# Patient Record
Sex: Male | Born: 2000 | Race: Black or African American | Hispanic: No | Marital: Single | Smoking: Never smoker
Health system: Southern US, Community
[De-identification: ages and names within clinical notes are randomized; demographics above are authoritative.]

## PROBLEM LIST (undated history)

## (undated) DIAGNOSIS — S89039A Salter-Harris Type III physeal fracture of upper end of unspecified tibia, initial encounter for closed fracture: Secondary | ICD-10-CM

## (undated) HISTORY — PX: CIRCUMCISION: SUR203

---
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
## 2021-02-26 ENCOUNTER — Encounter: Payer: Self-pay | Admitting: Physical Therapy

## 2021-02-26 ENCOUNTER — Ambulatory Visit: Payer: Medicaid Other | Attending: Orthopedic Surgery | Admitting: Physical Therapy

## 2021-02-26 ENCOUNTER — Other Ambulatory Visit: Payer: Self-pay

## 2021-02-26 DIAGNOSIS — G8929 Other chronic pain: Secondary | ICD-10-CM | POA: Diagnosis present

## 2021-02-26 DIAGNOSIS — M6281 Muscle weakness (generalized): Secondary | ICD-10-CM | POA: Diagnosis present

## 2021-02-26 DIAGNOSIS — M25561 Pain in right knee: Secondary | ICD-10-CM | POA: Diagnosis present

## 2021-02-26 NOTE — Patient Instructions (Signed)
Access Code: [email or web address] URL: [email or web address] Date: 02/26/2021 Prepared by: Rosana Hoes  Exercises Active Straight Leg Raise with Quad Set - 1 x daily - 4 x weekly - 3 sets - 10 reps Standing Terminal Knee Extension with Resistance - 1 x daily - 4 x weekly - 3 sets - 10 reps Goblet Squat with Kettlebell - 1 x daily - 4 x weekly - 3 sets - 10 reps Side Stepping with Resistance at Thighs - 1 x daily - 4 x weekly - 3 sets - 20 reps Prone Quadriceps Stretch with Strap - 2 x daily - 7 x weekly - 3 reps - 30 hold Gastroc Stretch on Wall - 2 x daily - 7 x weekly - 3 reps - 30 hold

## 2021-02-27 ENCOUNTER — Encounter: Payer: Self-pay | Admitting: Physical Therapy

## 2021-02-27 NOTE — Therapy (Signed)
[facility] [address] [place], [place], [address] Phone: [phone]   Fax:  [phone]  Physical Therapy Evaluation  Patient Details  Name: Benjamin Lam MRN: [id number] Date of Birth: 29-Dec-2000 Referring Provider (PT): Sheral ApleyMurphy, Timothy D, MD   Encounter Date: 02/26/2021   PT End of Session - 02/27/21 0809    Visit Number 1    Number of Visits 8    Date for PT Re-Evaluation 04/23/21    Authorization Type [employer]    Authorization - Visit Number 1    Authorization - Number of Visits 12    PT Start Time 1445    PT Stop Time 1530    PT Time Calculation (min) 45 min    Activity Tolerance Patient tolerated treatment well    Behavior During Therapy [facility] for tasks assessed/performed           Past Medical History:  Diagnosis Date  . Closed Salter-Harris type III physeal fracture of proximal end of tibia 12/14/2016    Past Surgical History:  Procedure Laterality Date  . CIRCUMCISION    . FASCIOTOMY Right 12/14/2016   Procedure: Anterior compression FASCIOTOMY;  Surgeon: Sheral Apleyimothy D Murphy, MD;  [facility];  Service: Orthopedics;  Laterality: Right;  . HARDWARE REMOVAL Right 06/05/2017   Procedure: HARDWARE REMOVAL, RIGHT PROXIMAL TIBIA SCREW REMOVAL;  Surgeon: Sheral ApleyMurphy, Timothy D, MD;  Location: [facility];  Service: Orthopedics;  Laterality: Right;  . ORIF TIBIA PLATEAU Right 12/14/2016   Procedure: LEG PERCANTANEOUS PROXIMAL TIBIAL FRACTURE;  Surgeon: Sheral Apleyimothy D Murphy, MD;  [facility];  Service: Orthopedics;  Laterality: Right;  . PATELLAR TENDON REPAIR  12/14/2016   Procedure: PATELLA TENDON REPAIR;  Surgeon: Sheral Apleyimothy D Murphy, MD;  [facility];  Service: Orthopedics;;    There were no vitals filed for this visit.    Subjective Assessment - 02/26/21 1447    Subjective Patient reports since 2018 he had a surgery, states he broke everything in his knee. Patient plays basketball and states  that when he goes long periods of playing and working out that he will have knee pain. Pain is located to the front of the knee, occasionally has non-painful clicking and popping in the knee. Patient is working out 4-5 days per week to get back ready for basketball in the fall. He is going back to school in August.    Limitations Lifting;Other (comment);Sitting   basketball   How long can you sit comfortably? 30-60 minutes will get tense    How long can you stand comfortably? No limitation    How long can you walk comfortably? No limitation    Patient Stated Goals Improve knee pain and strength to return to basketball    Currently in Pain? No/denies    Pain Score 0-No pain    Pain Location Knee    Pain Orientation Right    Pain Descriptors / Indicators Aching    Pain Type Chronic pain    Pain Onset More than a month ago    Pain Frequency Intermittent    Aggravating Factors  Sitting extended periods, playing basketball or working out days in a row without rest    Pain Relieving Factors Rest, ice, heat              [facility] PT Assessment - 02/27/21 0001      Assessment   Medical Diagnosis Right knee pain    Referring Provider (PT) Sheral ApleyMurphy, Timothy D, MD  Onset Date/Surgical Date 12/14/16    Next MD Visit Not scheduled    Prior Therapy Yes      Precautions   Precautions None      Restrictions   Weight Bearing Restrictions No      Balance Screen   Has the patient fallen in the past 6 months No    Has the patient had a decrease in activity level because of a fear of falling?  No    Is the patient reluctant to leave their home because of a fear of falling?  No      Home Tourist information centre manager residence      Prior Function   Level of Independence Independent    Vocation Student    Vocation Requirements Going back to [employer]      Cognition   Overall Cognitive Status Within Functional Limits for tasks assessed       Observation/Other Assessments   Observations Patient appears in no apparent distress    Focus on Therapeutic Outcomes (FOTO)  NA - MCD      Functional Tests   Functional tests Squat;Single Leg Squat      Squat   Comments Patient with decreased depth and when cued to increased depth would perform knee dominant pattern with heels coming up; when propped heels up on barbell plates able to perform adequate depth squat with proper technique      Single Leg Squat   Comments Patient with decreased depth and mild dynamic knee valgus      ROM / Strength   AROM / PROM / Strength AROM;Strength;PROM      AROM   AROM Assessment Site Knee;Ankle    Right/Left Knee Right;Left    Right Knee Extension 0    Right Knee Flexion 135    Left Knee Extension 8   hyperextension   Left Knee Flexion 135    Right/Left Ankle Right;Left    Right Ankle Dorsiflexion 3    Left Ankle Dorsiflexion 8      PROM   PROM Assessment Site Knee    Right/Left Knee Right    Right Knee Extension 5   hyper     Strength   Strength Assessment Site Hip;Knee;Ankle    Right/Left Hip Right;Left    Right Hip Flexion 4+/5    Right Hip Extension 4/5    Right Hip ABduction 4/5    Left Hip Flexion 4+/5    Left Hip Extension 4+/5    Left Hip ABduction 4+/5    Right/Left Knee Right;Left    Right Knee Flexion 5/5    Right Knee Extension 4+/5    Left Knee Flexion 5/5    Left Knee Extension 5/5    Right/Left Ankle Right;Left    Right Ankle Dorsiflexion 5/5    Right Ankle Plantar Flexion 4+/5    Left Ankle Dorsiflexion 5/5    Left Ankle Plantar Flexion 5/5      Flexibility   Soft Tissue Assessment /Muscle Length yes    Hamstrings WFL bilaterally    Quadriceps Limited on right      Palpation   Patella mobility WFL    Palpation comment Non-TTP      Special Tests   Other special tests All meniscal and ligamentous testing negative                      Objective measurements completed on examination: See  above  findings.       [facility] Adult PT Treatment/Exercise - 02/27/21 0001      Exercises   Exercises Knee/Hip      Knee/Hip Exercises: Stretches   Quad Stretch 2 reps;30 seconds    Quad Stretch Limitations prone quad stretch    Gastroc Stretch 2 reps;30 seconds    Gastroc Stretch Limitations standing runner stretch      Knee/Hip Exercises: Standing   Terminal Knee Extension 2 sets;10 reps    Theraband Level (Terminal Knee Extension) Level 3 (Green)    Functional Squat 2 sets;10 reps   15#   Functional Squat Limitations goblet squat with heels propped on barbell plates    Other Standing Knee Exercises Lateral band walk with green around knees 2 x 20      Knee/Hip Exercises: Supine   Straight Leg Raises 2 sets;10 reps    Straight Leg Raises Limitations towel roll placed under heel to encourage active hyperextension                  PT Education - 02/26/21 1543    Education Details Exam findings, POC, HEP    Person(s) Educated Patient    Methods Explanation;Demonstration;Tactile cues;Verbal cues;Handout    Comprehension Verbalized understanding;Returned demonstration;Verbal cues required;Tactile cues required;Need further instruction            PT Short Term Goals - 02/27/21 0816      PT SHORT TERM GOAL #1   Title Patient will be I with initial HEP to progress with PT    Baseline provided at eval    Time 4    Period Weeks    Status New    Target Date 03/26/21      PT SHORT TERM GOAL #2   Title Patient will be able to perform 10 reps SLR without extension lag or fatigue/difficulty    Baseline patient reports difficulty and slight extension lag due to fatigue with 10 reps SLR    Time 4    Period Weeks    Status New    Target Date 03/26/21      PT SHORT TERM GOAL #3   Title Patient will exhibit 5 deg right knee ATKHE to improve quad and knee control    Baseline right knee 0 deg extension    Time 4    Period Weeks    Status New    Target Date 03/26/21       PT SHORT TERM GOAL #4   Title Patient will achieve 5 deg ankle DF on right to improve squat depth    Baseline 3 deg right ankle DF    Time 4    Period Weeks    Status New    Target Date 03/26/21             PT Long Term Goals - 02/27/21 0820      PT LONG TERM GOAL #1   Title Patient will be I with final HEP to maintain progress from PT    Baseline provided at eval    Time 8    Period Weeks    Status New    Target Date 04/23/21      PT LONG TERM GOAL #2   Title Patient will exhibit 5/5 MMT knee strength to improve tolerance for basketball    Baseline 4+/5 MMT    Time 8    Period Weeks    Status New    Target Date 04/23/21  PT LONG TERM GOAL #3   Title Patient will exhibit >/= 4+/5 MMT hip strength to improve single leg control with squat to reduce dynamic knee valgus    Baseline 4/5 MMT    Time 8    Period Weeks    Status New    Target Date 04/23/21      PT LONG TERM GOAL #4   Title Patient will report no limitation with consecutive days of basketball training to improve functional ability    Baseline patient reports increased pain and limitation with multiple days of basketball    Time 8    Period Weeks    Status New    Target Date 04/23/21      PT LONG TERM GOAL #5   Title Patient will achieve >/= 10 deg ankle DF to improve squat technique to reduce stress on anterior knee    Baseline right 3 deg, left 8 deg    Time 8    Period Weeks    Status New    Target Date 04/23/21                  Plan - 02/27/21 0810    Clinical Impression Statement Patient presents to PT with report of chronic right knee pain with continuous days of activity and sitting extended periods. Patient's knee pain seems most consistent wth patellofemoral pain. He exhibit gross strength deficit of right side, reporting difficulty with 10 reps of SLR and inability to perform active hyperextension of right knee equal to passive motion. Patient also exhibit limitations in quad  and calf flexibility that limit squat for and result in increase stress placed on anterior knee. Patient provided exercises to addess listed impairments and he would benefit from continued skilled PT to progress his strength and mobility in order to reduce pain and return to high level athletics including basketball without limitation.    Personal Factors and Comorbidities Past/Current Experience;Time since onset of injury/illness/exacerbation    Examination-Activity Limitations Locomotion Level;Sit;Squat;Stairs;Lift    Examination-Participation Restrictions Community Activity;School;Other   basketball   Stability/Clinical Decision Making Stable/Uncomplicated    Clinical Decision Making Low    Rehab Potential Good    PT Frequency 1x / week    PT Duration 8 weeks    PT Treatment/Interventions ADLs/Self Care Home Management;Aquatic Therapy;Cryotherapy;Electrical Stimulation;Iontophoresis 4mg /ml Dexamethasone;Moist Heat;Ultrasound;Neuromuscular re-education;Balance training;Therapeutic exercise;Therapeutic activities;Functional mobility training;Stair training;Gait training;Patient/family education;Manual techniques;Dry needling;Passive range of motion;Taping;Spinal Manipulations;Joint Manipulations    PT Next Visit Plan Review HEP and progress PRN, continue quad strengthening and improve active hyperextension, manual/stretching for quad and calf flexibility, hip strengthening (squat, step-up, lunge, deadlift, etc)    PT Home Exercise Plan    Consulted and Agree with Plan of Care Patient           Patient will benefit from skilled therapeutic intervention in order to improve the following deficits and impairments:  Decreased range of motion,Pain,Decreased activity tolerance,Decreased strength,Impaired flexibility,Improper body mechanics  Visit Diagnosis: Chronic pain of right knee  Muscle weakness (generalized)     Problem List Patient Active Problem List   Diagnosis Date Noted   . Closed Salter-Harris type III physeal fracture of proximal end of tibia 12/14/2016  . Closed Salter-Harris type III physeal fracture of proximal end of right tibia 12/14/2016    02/13/2017, PT, DPT, LAT, ATC 02/27/21  8:25 AM Phone: [phone] Fax: [phone]   [facility] [address] [place], [place], [place] Phone: [phone]   Fax:  [phone]  Name: Benjamin Lam MRN: [id number] Date of Birth: [birth year]

## 2021-03-09 ENCOUNTER — Other Ambulatory Visit: Payer: Self-pay

## 2021-03-09 ENCOUNTER — Ambulatory Visit: Payer: Medicaid Other | Admitting: Physical Therapy

## 2021-03-09 ENCOUNTER — Encounter: Payer: Self-pay | Admitting: Physical Therapy

## 2021-03-09 DIAGNOSIS — M25561 Pain in right knee: Secondary | ICD-10-CM

## 2021-03-09 DIAGNOSIS — G8929 Other chronic pain: Secondary | ICD-10-CM

## 2021-03-09 DIAGNOSIS — M6281 Muscle weakness (generalized): Secondary | ICD-10-CM

## 2021-03-09 NOTE — Therapy (Signed)
[facility] Outpatient Rehabilitation [facility] [address] [place], [place], [address] Phone: [phone]   Fax:  [phone]  Physical Therapy Treatment  Patient Details  Name: Benjamin Lam MRN: [id number] Date of Birth: 2001/07/01 Referring Provider (PT): Sheral Apley, MD   Encounter Date: 03/09/2021   PT End of Session - 03/09/21 1000    Visit Number 2    Number of Visits 8    Date for PT Re-Evaluation 04/23/21    Authorization Type [employer]    Authorization - Visit Number 2    Authorization - Number of Visits 12    PT Start Time [phone]    PT Stop Time 1055    PT Time Calculation (min) 59 min    Activity Tolerance Patient tolerated treatment well    Behavior During Therapy [facility] for tasks assessed/performed           Past Medical History:  Diagnosis Date  . Closed Salter-Harris type III physeal fracture of proximal end of tibia 12/14/2016    Past Surgical History:  Procedure Laterality Date  . CIRCUMCISION    . FASCIOTOMY Right 12/14/2016   Procedure: Anterior compression FASCIOTOMY;  Surgeon: Sheral Apley, MD;  Location: [facility] OR;  Service: Orthopedics;  Laterality: Right;  . HARDWARE REMOVAL Right 06/05/2017   Procedure: HARDWARE REMOVAL, RIGHT PROXIMAL TIBIA SCREW REMOVAL;  Surgeon: Sheral Apley, MD;  Location: [facility];  Service: Orthopedics;  Laterality: Right;  . ORIF TIBIA PLATEAU Right 12/14/2016   Procedure: LEG PERCANTANEOUS PROXIMAL TIBIAL FRACTURE;  Surgeon: Sheral Apley, MD;  Location: [facility] OR;  Service: Orthopedics;  Laterality: Right;  . PATELLAR TENDON REPAIR  12/14/2016   Procedure: PATELLA TENDON REPAIR;  Surgeon: Sheral Apley, MD;  Location: [facility] OR;  Service: Orthopedics;;    There were no vitals filed for this visit.   Subjective Assessment - 03/09/21 0959    Subjective Patient reports he is doing well, denies pain and states he feels his strength is coming along.    Patient Stated Goals  Improve knee pain and strength to return to basketball    Currently in Pain? No/denies              [facility] PT Assessment - 03/09/21 0001      Functional Tests   Functional tests Single leg stance      Single Leg Stance   Comments Patient able to maintain SLS > 30 sec each but with greater difficulty on right, requires UE assist for dynamic balance on right                         [facility] Adult PT Treatment/Exercise - 03/09/21 0001      Exercises   Exercises Knee/Hip      Knee/Hip Exercises: Stretches   Lobbyist 2 reps;30 seconds    Quad Stretch Limitations prone quad stretch    Gastroc Stretch 2 reps;30 seconds    Gastroc Stretch Limitations slant board      Knee/Hip Exercises: Aerobic   Elliptical L5 R1 x 5 min while taking subjective      Knee/Hip Exercises: Machines for Strengthening   Cybex Leg Press DL: 354# x 8, 562# 3 x 6; SL: 60# x 6, 70# x 6, 80# 2 x 6      Knee/Hip Exercises: Standing   Other Standing Knee Exercises SL RDL with slider 2 x 8   holding 10# in contralateral hand  Other Standing Knee Exercises Rear foot elevated split squat 2 x 8   contralateral UE support for balance, holding 10# in ipsilateral hand     Knee/Hip Exercises: Sidelying   Other Sidelying Knee/Hip Exercises Side plank on knees with clamshell 2 x 10 with green band      Modalities   Modalities Cryotherapy   post-therapy     Cryotherapy   Number Minutes Cryotherapy 10 Minutes    Cryotherapy Location Knee   right   Type of Cryotherapy Ice pack                  PT Education - 03/09/21 1000    Education Details HEP update    Person(s) Educated Patient    Methods Explanation;Demonstration;Tactile cues;Verbal cues;Handout    Comprehension Verbalized understanding;Need further instruction;Returned demonstration;Verbal cues required;Tactile cues required            PT Short Term Goals - 02/27/21 0816      PT SHORT TERM GOAL #1   Title Patient will be I  with initial HEP to progress with PT    Baseline provided at eval    Time 4    Period Weeks    Status New    Target Date 03/26/21      PT SHORT TERM GOAL #2   Title Patient will be able to perform 10 reps SLR without extension lag or fatigue/difficulty    Baseline patient reports difficulty and slight extension lag due to fatigue with 10 reps SLR    Time 4    Period Weeks    Status New    Target Date 03/26/21      PT SHORT TERM GOAL #3   Title Patient will exhibit 5 deg right knee ATKHE to improve quad and knee control    Baseline right knee 0 deg extension    Time 4    Period Weeks    Status New    Target Date 03/26/21      PT SHORT TERM GOAL #4   Title Patient will achieve 5 deg ankle DF on right to improve squat depth    Baseline 3 deg right ankle DF    Time 4    Period Weeks    Status New    Target Date 03/26/21             PT Long Term Goals - 02/27/21 0820      PT LONG TERM GOAL #1   Title Patient will be I with final HEP to maintain progress from PT    Baseline provided at eval    Time 8    Period Weeks    Status New    Target Date 04/23/21      PT LONG TERM GOAL #2   Title Patient will exhibit 5/5 MMT knee strength to improve tolerance for basketball    Baseline 4+/5 MMT    Time 8    Period Weeks    Status New    Target Date 04/23/21      PT LONG TERM GOAL #3   Title Patient will exhibit >/= 4+/5 MMT hip strength to improve single leg control with squat to reduce dynamic knee valgus    Baseline 4/5 MMT    Time 8    Period Weeks    Status New    Target Date 04/23/21      PT LONG TERM GOAL #4   Title Patient will report no limitation with consecutive days  of basketball training to improve functional ability    Baseline patient reports increased pain and limitation with multiple days of basketball    Time 8    Period Weeks    Status New    Target Date 04/23/21      PT LONG TERM GOAL #5   Title Patient will achieve >/= 10 deg ankle DF to  improve squat technique to reduce stress on anterior knee    Baseline right 3 deg, left 8 deg    Time 8    Period Weeks    Status New    Target Date 04/23/21                 Plan - 03/09/21 1001    Clinical Impression Statement Patient tolerated therapy well with no adverse effects. Therapy focused primarily on progressing strength for patient. Exercises incorporated single leg strengthening and patient did demonstrate a balance deficit greater on the right. He did require occasional cueing for exercise technique and slow/controlled movement. No pain reported with therapy and HEP updated this visit. Finished with cold pack to reduce residual soreness. Patient would benefit from continued skilled PT to progress his strength and mobility in order to reduce pain and return to high level athletics including basketball without limitation.    PT Treatment/Interventions ADLs/Self Care Home Management;Aquatic Therapy;Cryotherapy;Electrical Stimulation;Iontophoresis 4mg /ml Dexamethasone;Moist Heat;Ultrasound;Neuromuscular re-education;Balance training;Therapeutic exercise;Therapeutic activities;Functional mobility training;Stair training;Gait training;Patient/family education;Manual techniques;Dry needling;Passive range of motion;Taping;Spinal Manipulations;Joint Manipulations    PT Next Visit Plan Review HEP and progress PRN, continue quad strengthening and improve active hyperextension, manual/stretching for quad and calf flexibility, hip strengthening (squat, step-up, lunge, deadlift, etc)    PT Home Exercise Plan    Consulted and Agree with Plan of Care Patient           Patient will benefit from skilled therapeutic intervention in order to improve the following deficits and impairments:  Decreased range of motion,Pain,Decreased activity tolerance,Decreased strength,Impaired flexibility,Improper body mechanics  Visit Diagnosis: Chronic pain of right knee  Muscle weakness  (generalized)     Problem List Patient Active Problem List   Diagnosis Date Noted  . Closed Salter-Harris type III physeal fracture of proximal end of tibia 12/14/2016  . Closed Salter-Harris type III physeal fracture of proximal end of right tibia 12/14/2016    02/13/2017, PT, DPT, LAT, ATC 03/09/21  10:53 AM Phone: [phone] Fax: [phone]   [facility] [address] [place], [place], [place] Phone: [phone]   Fax:  [phone]  Name: Benjamin Lam MRN: [name] Date of Birth: [birth year]

## 2021-03-09 NOTE — Patient Instructions (Signed)
Access Code: [email or web address] URL: [email or web address] Date: 03/09/2021 Prepared by: Rosana Hoes  Exercises Active Straight Leg Raise with Quad Set - 1 x daily - 4 x weekly - 3 sets - 10 reps Standing Terminal Knee Extension with Resistance - 1 x daily - 4 x weekly - 3 sets - 10 reps Goblet Squat with Kettlebell - 1 x daily - 4 x weekly - 3 sets - 10 reps Side Stepping with Resistance at Thighs - 1 x daily - 4 x weekly - 3 sets - 20 reps Single Leg Lunge with Foot on Bench - 1 x daily - 4 x weekly - 3-4 sets - 6-8 reps Prone Quadriceps Stretch with Strap - 2 x daily - 7 x weekly - 3 reps - 30 hold Gastroc Stretch on Wall - 2 x daily - 7 x weekly - 3 reps - 30 hold

## 2021-03-16 ENCOUNTER — Encounter: Payer: Self-pay | Admitting: Physical Therapy

## 2021-03-16 ENCOUNTER — Other Ambulatory Visit: Payer: Self-pay

## 2021-03-16 ENCOUNTER — Ambulatory Visit: Payer: Medicaid Other | Attending: Orthopedic Surgery | Admitting: Physical Therapy

## 2021-03-16 DIAGNOSIS — M25561 Pain in right knee: Secondary | ICD-10-CM | POA: Diagnosis present

## 2021-03-16 DIAGNOSIS — G8929 Other chronic pain: Secondary | ICD-10-CM | POA: Diagnosis present

## 2021-03-16 DIAGNOSIS — M6281 Muscle weakness (generalized): Secondary | ICD-10-CM | POA: Diagnosis present

## 2021-03-16 NOTE — Therapy (Signed)
[facility] Outpatient Rehabilitation [facility] [address] [place], [place], [address] Phone: [phone]   Fax:  [phone]  Physical Therapy Treatment  Patient Details  Name: Benjamin Lam MRN: [id number] Date of Birth: 2001-09-24 Referring Provider (PT): Sheral Apley, MD   Encounter Date: 03/16/2021   PT End of Session - 03/16/21 1016    Visit Number 3    Number of Visits 8    Date for PT Re-Evaluation 04/23/21    Authorization Type [employer]    Authorization - Visit Number 3    Authorization - Number of Visits 12    PT Start Time 1000    PT Stop Time 1045    PT Time Calculation (min) 45 min    Activity Tolerance Patient tolerated treatment well    Behavior During Therapy [facility] for tasks assessed/performed           Past Medical History:  Diagnosis Date  . Closed Salter-Harris type III physeal fracture of proximal end of tibia 12/14/2016    Past Surgical History:  Procedure Laterality Date  . CIRCUMCISION    . FASCIOTOMY Right 12/14/2016   Procedure: Anterior compression FASCIOTOMY;  Surgeon: Sheral Apley, MD;  Location: [facility] OR;  Service: Orthopedics;  Laterality: Right;  . HARDWARE REMOVAL Right 06/05/2017   Procedure: HARDWARE REMOVAL, RIGHT PROXIMAL TIBIA SCREW REMOVAL;  Surgeon: Sheral Apley, MD;  Location: [facility];  Service: Orthopedics;  Laterality: Right;  . ORIF TIBIA PLATEAU Right 12/14/2016   Procedure: LEG PERCANTANEOUS PROXIMAL TIBIAL FRACTURE;  Surgeon: Sheral Apley, MD;  Location: [facility] OR;  Service: Orthopedics;  Laterality: Right;  . PATELLAR TENDON REPAIR  12/14/2016   Procedure: PATELLA TENDON REPAIR;  Surgeon: Sheral Apley, MD;  Location: [facility] OR;  Service: Orthopedics;;    There were no vitals filed for this visit.   Subjective Assessment - 03/16/21 1013    Subjective Patient reports he is doing well with no new issues. States he starts 2/days next week for workouts and heavy basketball  training.    Patient Stated Goals Improve knee pain and strength to return to basketball    Currently in Pain? No/denies              [facility] PT Assessment - 03/16/21 0001      AROM   Right Ankle Dorsiflexion 4      Strength   Right Hip Extension 4/5    Right Hip ABduction 4/5    Right Knee Extension 5/5                         [facility] Adult PT Treatment/Exercise - 03/16/21 0001      Exercises   Exercises Knee/Hip      Knee/Hip Exercises: Aerobic   Elliptical L5 R1 x 5 min while taking subjective      Knee/Hip Exercises: Machines for Strengthening   Cybex Leg Press DL: 341# x 10, 937# x 8, 902# 2 x 8      Knee/Hip Exercises: Standing   Extension Limitations Kettlebell swing 25# 3 x 10    Functional Squat Limitations TRX assisted SL squat 3 x 6   minimal cueing for dynamic knee control   Lunge Walking - Round Trips Lateral band walk with blue around knees 3 x 20    Other Standing Knee Exercises Sled push/pull with PT weight x 3 lengths    Other Standing Knee Exercises Rear foot elevated split  squat with 25# ipsilateral 3 x 8   contralateral UE support on FR     Knee/Hip Exercises: Supine   Other Supine Knee/Hip Exercises Hip thrust 75# barbell 3 x 8                  PT Education - 03/16/21 1013    Education Details HEP    Person(s) Educated Patient    Methods Explanation    Comprehension Verbalized understanding;Need further instruction            PT Short Term Goals - 02/27/21 0816      PT SHORT TERM GOAL #1   Title Patient will be I with initial HEP to progress with PT    Baseline provided at eval    Time 4    Period Weeks    Status New    Target Date 03/26/21      PT SHORT TERM GOAL #2   Title Patient will be able to perform 10 reps SLR without extension lag or fatigue/difficulty    Baseline patient reports difficulty and slight extension lag due to fatigue with 10 reps SLR    Time 4    Period Weeks    Status New    Target Date  03/26/21      PT SHORT TERM GOAL #3   Title Patient will exhibit 5 deg right knee ATKHE to improve quad and knee control    Baseline right knee 0 deg extension    Time 4    Period Weeks    Status New    Target Date 03/26/21      PT SHORT TERM GOAL #4   Title Patient will achieve 5 deg ankle DF on right to improve squat depth    Baseline 3 deg right ankle DF    Time 4    Period Weeks    Status New    Target Date 03/26/21             PT Long Term Goals - 02/27/21 0820      PT LONG TERM GOAL #1   Title Patient will be I with final HEP to maintain progress from PT    Baseline provided at eval    Time 8    Period Weeks    Status New    Target Date 04/23/21      PT LONG TERM GOAL #2   Title Patient will exhibit 5/5 MMT knee strength to improve tolerance for basketball    Baseline 4+/5 MMT    Time 8    Period Weeks    Status New    Target Date 04/23/21      PT LONG TERM GOAL #3   Title Patient will exhibit >/= 4+/5 MMT hip strength to improve single leg control with squat to reduce dynamic knee valgus    Baseline 4/5 MMT    Time 8    Period Weeks    Status New    Target Date 04/23/21      PT LONG TERM GOAL #4   Title Patient will report no limitation with consecutive days of basketball training to improve functional ability    Baseline patient reports increased pain and limitation with multiple days of basketball    Time 8    Period Weeks    Status New    Target Date 04/23/21      PT LONG TERM GOAL #5   Title Patient will achieve >/= 10 deg ankle DF  to improve squat technique to reduce stress on anterior knee    Baseline right 3 deg, left 8 deg    Time 8    Period Weeks    Status New    Target Date 04/23/21                 Plan - 03/16/21 1016    Clinical Impression Statement Patient tolerated therapy well with no adverse effects. Therapy focused on continued strengthening and SL dynamic control with good tolerance. Patient able to progress with  resistance and further challenges with SL exercises. Minimal cueing required for proper dynamic knee control with single leg squats. Patient would benefit from continued skilled PT to progress his strength and mobility in order to reduce pain and return to high level athletics including basketball without limitation.    PT Treatment/Interventions ADLs/Self Care Home Management;Aquatic Therapy;Cryotherapy;Electrical Stimulation;Iontophoresis 4mg /ml Dexamethasone;Moist Heat;Ultrasound;Neuromuscular re-education;Balance training;Therapeutic exercise;Therapeutic activities;Functional mobility training;Stair training;Gait training;Patient/family education;Manual techniques;Dry needling;Passive range of motion;Taping;Spinal Manipulations;Joint Manipulations    PT Next Visit Plan Review HEP and progress PRN, continue quad strengthening and improve active hyperextension, manual/stretching for quad and calf flexibility, hip strengthening (squat, step-up, lunge, deadlift, etc), initiate plyometric training    PT Home Exercise Plan [phone]    Consulted and Agree with Plan of Care Patient           Patient will benefit from skilled therapeutic intervention in order to improve the following deficits and impairments:  Decreased range of motion,Pain,Decreased activity tolerance,Decreased strength,Impaired flexibility,Improper body mechanics  Visit Diagnosis: Chronic pain of right knee  Muscle weakness (generalized)     Problem List Patient Active Problem List   Diagnosis Date Noted  . Closed Salter-Harris type III physeal fracture of proximal end of tibia 12/14/2016  . Closed Salter-Harris type III physeal fracture of proximal end of right tibia 12/14/2016    02/13/2017, PT, DPT, LAT, ATC 03/16/21  11:08 AM Phone: [phone] Fax: [phone]   [facility] Outpatient Rehabilitation [facility] [address] [place], [place], [place] Phone: [phone]   Fax:   [phone]  Name: Tison Leibold MRN: [name] Date of Birth: [birth year]

## 2021-03-23 ENCOUNTER — Ambulatory Visit: Payer: Medicaid Other | Admitting: Physical Therapy

## 2021-03-30 ENCOUNTER — Encounter: Payer: Self-pay | Admitting: Physical Therapy

## 2021-03-30 ENCOUNTER — Ambulatory Visit: Payer: Medicaid Other | Admitting: Physical Therapy

## 2021-03-30 ENCOUNTER — Other Ambulatory Visit: Payer: Self-pay

## 2021-03-30 DIAGNOSIS — G8929 Other chronic pain: Secondary | ICD-10-CM

## 2021-03-30 DIAGNOSIS — M25561 Pain in right knee: Secondary | ICD-10-CM | POA: Diagnosis not present

## 2021-03-30 DIAGNOSIS — M6281 Muscle weakness (generalized): Secondary | ICD-10-CM

## 2021-03-30 NOTE — Therapy (Signed)
[facility] [address] [place], [place], [address] Phone: [phone]   Fax:  [phone]  Physical Therapy Treatment  Patient Details  Name: Benjamin Lam MRN: [id number] Date of Birth: May 08, 2001 Referring Provider (PT): Sheral Apley, MD   Encounter Date: 03/30/2021   PT End of Session - 03/30/21 1016     Visit Number 4    Number of Visits 8    Date for PT Re-Evaluation 04/23/21    Authorization Type [employer]    Authorization - Visit Number 4    Authorization - Number of Visits 12    PT Start Time 1005    PT Stop Time 1045    PT Time Calculation (min) 40 min    Activity Tolerance Patient tolerated treatment well    Behavior During Therapy [facility] for tasks assessed/performed             Past Medical History:  Diagnosis Date   Closed Salter-Harris type III physeal fracture of proximal end of tibia 12/14/2016    Past Surgical History:  Procedure Laterality Date   CIRCUMCISION     FASCIOTOMY Right 12/14/2016   Procedure: Anterior compression FASCIOTOMY;  Surgeon: Sheral Apley, MD;  Location: [facility] OR;  Service: Orthopedics;  Laterality: Right;   HARDWARE REMOVAL Right 06/05/2017   Procedure: HARDWARE REMOVAL, RIGHT PROXIMAL TIBIA SCREW REMOVAL;  Surgeon: Sheral Apley, MD;  Location: [facility];  Service: Orthopedics;  Laterality: Right;   ORIF TIBIA PLATEAU Right 12/14/2016   Procedure: LEG PERCANTANEOUS PROXIMAL TIBIAL FRACTURE;  Surgeon: Sheral Apley, MD;  Location: [facility] OR;  Service: Orthopedics;  Laterality: Right;   PATELLAR TENDON REPAIR  12/14/2016   Procedure: PATELLA TENDON REPAIR;  Surgeon: Sheral Apley, MD;  Location: [facility] OR;  Service: Orthopedics;;    There were no vitals filed for this visit.   Subjective Assessment - 03/30/21 1014     Subjective Patient reports he has taken this week off from the trainer due to him being out of town. Basketball has been going well.  States the knee is feeling pretty good, he did have a some soreness right after he got done playing but resolved when he iced it.    Patient Stated Goals Improve knee pain and strength to return to basketball    Currently in Pain? No/denies    Pain Onset More than a month ago    Pain Frequency Intermittent                [facility] PT Assessment - 03/30/21 0001       AROM   Right Knee Extension 0    Right Ankle Dorsiflexion 4      Strength   Right Hip Extension 4/5    Right Hip ABduction 4/5                           [facility] Adult PT Treatment/Exercise - 03/30/21 0001       Exercises   Exercises Knee/Hip      Knee/Hip Exercises: Standing   Heel Raises 3 sets;15 reps    Heel Raises Limitations SL edge of step    Step Down Limitations Deadlift with 125# barbell 3 x 6    Lunge Walking - Round Trips Lateral band walk with blue around mid-shin 3 x 20    SLS SLS on Rockerboard 3 x 30 sec   circular bottom   Other Standing  Knee Exercises SL RDL with FM cable 13# 3 x 10    Other Standing Knee Exercises Rear foot elevated split squat with 25# bilat 3 x 8      Knee/Hip Exercises: Supine   Straight Leg Raises 10 reps                    PT Education - 03/30/21 1015     Education Details HEP    Person(s) Educated Patient    Methods Explanation    Comprehension Verbalized understanding;Need further instruction              PT Short Term Goals - 03/30/21 1047       PT SHORT TERM GOAL #1   Title Patient will be I with initial HEP to progress with PT    Baseline patient I with initial HEP    Time 4    Period Weeks    Status Achieved    Target Date 03/26/21      PT SHORT TERM GOAL #2   Title Patient will be able to perform 10 reps SLR without extension lag or fatigue/difficulty    Baseline patient able to perform 10 SLR    Time 4    Period Weeks    Status Achieved    Target Date 03/26/21      PT SHORT TERM GOAL #3   Title Patient will  exhibit 5 deg right knee ATKHE to improve quad and knee control    Baseline right knee 0 deg extension    Time 4    Period Weeks    Status On-going    Target Date 03/26/21      PT SHORT TERM GOAL #4   Title Patient will achieve 5 deg ankle DF on right to improve squat depth    Baseline 4 deg right ankle DF    Time 4    Period Weeks    Status On-going    Target Date 03/26/21               PT Long Term Goals - 02/27/21 0820       PT LONG TERM GOAL #1   Title Patient will be I with final HEP to maintain progress from PT    Baseline provided at eval    Time 8    Period Weeks    Status New    Target Date 04/23/21      PT LONG TERM GOAL #2   Title Patient will exhibit 5/5 MMT knee strength to improve tolerance for basketball    Baseline 4+/5 MMT    Time 8    Period Weeks    Status New    Target Date 04/23/21      PT LONG TERM GOAL #3   Title Patient will exhibit >/= 4+/5 MMT hip strength to improve single leg control with squat to reduce dynamic knee valgus    Baseline 4/5 MMT    Time 8    Period Weeks    Status New    Target Date 04/23/21      PT LONG TERM GOAL #4   Title Patient will report no limitation with consecutive days of basketball training to improve functional ability    Baseline patient reports increased pain and limitation with multiple days of basketball    Time 8    Period Weeks    Status New    Target Date 04/23/21      PT LONG TERM  GOAL #5   Title Patient will achieve >/= 10 deg ankle DF to improve squat technique to reduce stress on anterior knee    Baseline right 3 deg, left 8 deg    Time 8    Period Weeks    Status New    Target Date 04/23/21                   Plan - 03/30/21 1024     Clinical Impression Statement Patient tolerated therapy well with no adverse effects. Therapy continued progression of strength and SL dynamic control. Patient is progressing well with resistance and challenges to SL stability. He does exhibit  continued deficit with SL balance and dynamic control bilaterally but worse on right. Patient would benefit from continued skilled PT to progress his strength and mobility in order to reduce pain and return to high level athletics including basketball without limitation.    PT Treatment/Interventions ADLs/Self Care Home Management;Aquatic Therapy;Cryotherapy;Electrical Stimulation;Iontophoresis 4mg /ml Dexamethasone;Moist Heat;Ultrasound;Neuromuscular re-education;Balance training;Therapeutic exercise;Therapeutic activities;Functional mobility training;Stair training;Gait training;Patient/family education;Manual techniques;Dry needling;Passive range of motion;Taping;Spinal Manipulations;Joint Manipulations    PT Next Visit Plan Review HEP and progress PRN, continue quad strengthening and improve active hyperextension, manual/stretching for quad and calf flexibility, hip strengthening (squat, step-up, lunge, deadlift, etc), initiate plyometric training    PT Home Exercise Plan    Consulted and Agree with Plan of Care Patient             Patient will benefit from skilled therapeutic intervention in order to improve the following deficits and impairments:  Decreased range of motion, Pain, Decreased activity tolerance, Decreased strength, Impaired flexibility, Improper body mechanics  Visit Diagnosis: Chronic pain of right knee  Muscle weakness (generalized)     Problem List Patient Active Problem List   Diagnosis Date Noted   Closed Salter-Harris type III physeal fracture of proximal end of tibia 12/14/2016   Closed Salter-Harris type III physeal fracture of proximal end of right tibia 12/14/2016    02/13/2017, PT, DPT, LAT, ATC 03/30/21  10:50 AM Phone: [phone] Fax: [phone]   [facility] [address] [place], [place], [place] Phone: [phone]   Fax:  [phone]  Name: Koron Godeaux MRN:  [name] Date of Birth: 2001-07-07

## 2021-04-06 ENCOUNTER — Ambulatory Visit: Payer: Medicaid Other | Admitting: Physical Therapy

## 2021-04-06 ENCOUNTER — Encounter: Payer: Self-pay | Admitting: Physical Therapy

## 2021-04-06 ENCOUNTER — Other Ambulatory Visit: Payer: Self-pay

## 2021-04-06 DIAGNOSIS — M6281 Muscle weakness (generalized): Secondary | ICD-10-CM

## 2021-04-06 DIAGNOSIS — M25561 Pain in right knee: Secondary | ICD-10-CM | POA: Diagnosis not present

## 2021-04-06 DIAGNOSIS — G8929 Other chronic pain: Secondary | ICD-10-CM

## 2021-04-06 NOTE — Therapy (Signed)
[facility] [address] [place], [place], [address] Phone: [phone]   Fax:  [phone]  Physical Therapy Treatment  Patient Details  Name: Benjamin Lam MRN: [id number] Date of Birth: 11/01/2000 Referring Provider (PT): Sheral Apley, MD   Encounter Date: 04/06/2021   PT End of Session - 04/06/21 1007     Visit Number 5    Number of Visits 8    Date for PT Re-Evaluation 04/23/21    Authorization Type [employer]    Authorization - Visit Number 5    Authorization - Number of Visits 12    PT Start Time 1002    PT Stop Time 1045    PT Time Calculation (min) 43 min    Activity Tolerance Patient tolerated treatment well    Behavior During Therapy [facility] for tasks assessed/performed             Past Medical History:  Diagnosis Date   Closed Salter-Harris type III physeal fracture of proximal end of tibia 12/14/2016    Past Surgical History:  Procedure Laterality Date   CIRCUMCISION     FASCIOTOMY Right 12/14/2016   Procedure: Anterior compression FASCIOTOMY;  Surgeon: Sheral Apley, MD;  Location: [facility] OR;  Service: Orthopedics;  Laterality: Right;   HARDWARE REMOVAL Right 06/05/2017   Procedure: HARDWARE REMOVAL, RIGHT PROXIMAL TIBIA SCREW REMOVAL;  Surgeon: Sheral Apley, MD;  Location: [facility];  Service: Orthopedics;  Laterality: Right;   ORIF TIBIA PLATEAU Right 12/14/2016   Procedure: LEG PERCANTANEOUS PROXIMAL TIBIAL FRACTURE;  Surgeon: Sheral Apley, MD;  Location: [facility] OR;  Service: Orthopedics;  Laterality: Right;   PATELLAR TENDON REPAIR  12/14/2016   Procedure: PATELLA TENDON REPAIR;  Surgeon: Sheral Apley, MD;  Location: [facility] OR;  Service: Orthopedics;;    There were no vitals filed for this visit.   Subjective Assessment - 04/06/21 1005     Subjective Patient reports that he was sore earlier this week from playing basketball for 3-4 hours over the weekend. He has taken it  easy this week and states he has been icing to reduce the inflammation.    Patient Stated Goals Improve knee pain and strength to return to basketball    Currently in Pain? Yes    Pain Score 1     Pain Location Knee    Pain Orientation Right    Pain Descriptors / Indicators Sore   "uncomfortable"   Pain Type Chronic pain    Pain Onset More than a month ago    Pain Frequency Intermittent                [facility] PT Assessment - 04/06/21 0001       AROM   Right Knee Extension 0    Right Ankle Dorsiflexion 5                           [facility] Adult PT Treatment/Exercise - 04/06/21 0001       Exercises   Exercises Knee/Hip      Knee/Hip Exercises: Stretches   Passive Hamstring Stretch 3 reps;30 seconds    Passive Hamstring Stretch Limitations seated foot in chair    Quad Stretch 3 reps;30 seconds    Quad Stretch Limitations prone quad stretch    Gastroc Stretch 3 reps;30 seconds    Gastroc Stretch Limitations slant board      Knee/Hip Exercises: Aerobic   Elliptical  L5 R1 x 5 min while taking subjective      Knee/Hip Exercises: Machines for Strengthening   Cybex Knee Extension SL 30# 3 x 8    Cybex Leg Press DL: 381# x 10, 771# 3 x 8      Knee/Hip Exercises: Standing   Lunge Walking - Round Trips Lateral shuffle against PT resistance x 3 lengths    SLS SLS on Airex with ball toss 3 x 30 sec    Other Standing Knee Exercises Rear foot elevated split squat with 25# bilat 3 x 8      Knee/Hip Exercises: Supine   Other Supine Knee/Hip Exercises TRX bridge with hamstring curl 3 x 10                    PT Education - 04/06/21 1007     Education Details HEP    Person(s) Educated Patient    Methods Explanation    Comprehension Verbalized understanding;Need further instruction              PT Short Term Goals - 04/06/21 1041       PT SHORT TERM GOAL #1   Title Patient will be I with initial HEP to progress with PT    Baseline patient I  with initial HEP    Time 4    Period Weeks    Status Achieved    Target Date 03/26/21      PT SHORT TERM GOAL #2   Title Patient will be able to perform 10 reps SLR without extension lag or fatigue/difficulty    Baseline patient able to perform 10 SLR    Time 4    Period Weeks    Status Achieved    Target Date 03/26/21      PT SHORT TERM GOAL #3   Title Patient will exhibit 5 deg right knee ATKHE to improve quad and knee control    Baseline right knee 0 deg extension    Time 4    Period Weeks    Status On-going    Target Date 03/26/21      PT SHORT TERM GOAL #4   Title Patient will achieve 5 deg ankle DF on right to improve squat depth    Baseline 5 deg right ankle DF    Time 4    Period Weeks    Status Achieved    Target Date 03/26/21               PT Long Term Goals - 02/27/21 0820       PT LONG TERM GOAL #1   Title Patient will be I with final HEP to maintain progress from PT    Baseline provided at eval    Time 8    Period Weeks    Status New    Target Date 04/23/21      PT LONG TERM GOAL #2   Title Patient will exhibit 5/5 MMT knee strength to improve tolerance for basketball    Baseline 4+/5 MMT    Time 8    Period Weeks    Status New    Target Date 04/23/21      PT LONG TERM GOAL #3   Title Patient will exhibit >/= 4+/5 MMT hip strength to improve single leg control with squat to reduce dynamic knee valgus    Baseline 4/5 MMT    Time 8    Period Weeks    Status New  Target Date 04/23/21      PT LONG TERM GOAL #4   Title Patient will report no limitation with consecutive days of basketball training to improve functional ability    Baseline patient reports increased pain and limitation with multiple days of basketball    Time 8    Period Weeks    Status New    Target Date 04/23/21      PT LONG TERM GOAL #5   Title Patient will achieve >/= 10 deg ankle DF to improve squat technique to reduce stress on anterior knee    Baseline right 3  deg, left 8 deg    Time 8    Period Weeks    Status New    Target Date 04/23/21                   Plan - 04/06/21 1007     Clinical Impression Statement Patient tolerated therapy well with no adverse effects. He does exhibit improve flexibility this visit but continues to exhibit overall limitation. Therapy continues to focus on progressing flexibility and strength to reduce right knee pain. He is is progressing well with strengthening exercises and tolerating greater resistance. No increase in pain noted with PT this visit. Patient would benefit from continued skilled PT to progress his strength and mobility in order to reduce pain and return to high level athletics including basketball without limitation.    PT Treatment/Interventions ADLs/Self Care Home Management;Aquatic Therapy;Cryotherapy;Electrical Stimulation;Iontophoresis 4mg /ml Dexamethasone;Moist Heat;Ultrasound;Neuromuscular re-education;Balance training;Therapeutic exercise;Therapeutic activities;Functional mobility training;Stair training;Gait training;Patient/family education;Manual techniques;Dry needling;Passive range of motion;Taping;Spinal Manipulations;Joint Manipulations    PT Next Visit Plan Review HEP and progress PRN, continue quad strengthening and improve active hyperextension, manual/stretching for quad and calf flexibility, hip strengthening (squat, step-up, lunge, deadlift, etc), initiate plyometric training    PT Home Exercise Plan    Consulted and Agree with Plan of Care Patient             Patient will benefit from skilled therapeutic intervention in order to improve the following deficits and impairments:  Decreased range of motion, Pain, Decreased activity tolerance, Decreased strength, Impaired flexibility, Improper body mechanics  Visit Diagnosis: Chronic pain of right knee  Muscle weakness (generalized)     Problem List Patient Active Problem List   Diagnosis Date Noted    Closed Salter-Harris type III physeal fracture of proximal end of tibia 12/14/2016   Closed Salter-Harris type III physeal fracture of proximal end of right tibia 12/14/2016    02/13/2017, PT, DPT, LAT, ATC 04/06/21  10:50 AM Phone: [phone] Fax: [phone]   [facility] [address] [place], [place], [place] Phone: [phone]   Fax:  [phone]  Name: Benjamin Lam MRN: [name] Date of Birth: 08/05/2001

## 2021-04-13 ENCOUNTER — Ambulatory Visit: Payer: Medicaid Other | Attending: Orthopedic Surgery | Admitting: Physical Therapy

## 2021-04-13 DIAGNOSIS — M25561 Pain in right knee: Secondary | ICD-10-CM | POA: Insufficient documentation

## 2021-04-13 DIAGNOSIS — G8929 Other chronic pain: Secondary | ICD-10-CM | POA: Insufficient documentation

## 2021-04-13 DIAGNOSIS — M6281 Muscle weakness (generalized): Secondary | ICD-10-CM | POA: Insufficient documentation

## 2021-04-17 ENCOUNTER — Telehealth: Payer: Self-pay | Admitting: Physical Therapy

## 2021-04-17 NOTE — Telephone Encounter (Signed)
Attempted to contact patient due to missed PT appointment on 04/13/21. Left VM informing patient of missed appointment and that was his last scheduled appointment. Advised patient to contact clinic to schedule further visits if needed, and of attendance policy.  Rosana Hoes, PT, DPT, LAT, ATC 04/17/21  10:19 AM Phone: [phone] Fax: [phone]

## 2021-05-01 ENCOUNTER — Ambulatory Visit: Payer: Medicaid Other | Admitting: Physical Therapy

## 2021-05-01 ENCOUNTER — Other Ambulatory Visit: Payer: Self-pay

## 2021-05-01 ENCOUNTER — Encounter: Payer: Self-pay | Admitting: Physical Therapy

## 2021-05-01 DIAGNOSIS — G8929 Other chronic pain: Secondary | ICD-10-CM

## 2021-05-01 DIAGNOSIS — M6281 Muscle weakness (generalized): Secondary | ICD-10-CM

## 2021-05-01 DIAGNOSIS — M25561 Pain in right knee: Secondary | ICD-10-CM | POA: Diagnosis present

## 2021-05-01 NOTE — Therapy (Addendum)
[place], [place], [address] Phone: [phone]   Fax:  [phone]  Physical Therapy Treatment / ERO / Discharge  Patient Details  Name: Benjamin Lam MRN: [id number] Date of Birth: November 28, 2000 Referring Provider (PT): Renette Butters, MD   Encounter Date: 05/01/2021   PT End of Session - 05/01/21 0921     Visit Number 6    Number of Visits 12    Date for PT Re-Evaluation 06/12/21    Authorization Type [employer]    Authorization - Visit Number 6    Authorization - Number of Visits 12    PT Start Time 0915    PT Stop Time 1005    PT Time Calculation (min) 50 min    Activity Tolerance Patient tolerated treatment well    Behavior During Therapy [facility] for tasks assessed/performed             Past Medical History:  Diagnosis Date   Closed Salter-Harris type III physeal fracture of proximal end of tibia 12/14/2016    Past Surgical History:  Procedure Laterality Date   CIRCUMCISION     FASCIOTOMY Right 12/14/2016   Procedure: Anterior compression FASCIOTOMY;  Surgeon: Renette Butters, MD;  Location: [place];  Service: Orthopedics;  Laterality: Right;   HARDWARE REMOVAL Right 06/05/2017   Procedure: HARDWARE REMOVAL, RIGHT PROXIMAL TIBIA SCREW REMOVAL;  Surgeon: Renette Butters, MD;  Location: [place];  Service: Orthopedics;  Laterality: Right;   ORIF TIBIA PLATEAU Right 12/14/2016   Procedure: LEG PERCANTANEOUS PROXIMAL TIBIAL FRACTURE;  Surgeon: Renette Butters, MD;  Location: [place];  Service: Orthopedics;  Laterality: Right;   PATELLAR TENDON REPAIR  12/14/2016   Procedure: PATELLA TENDON REPAIR;  Surgeon: Renette Butters, MD;  Location: [place];  Service: Orthopedics;;    There were no vitals filed for this visit.   Subjective Assessment - 05/01/21 0919     Subjective Patient reports he has been working out a lot recently, states the knee is feeling pretty good. Still  feels a small pop when he bends his knee. He does experience some achiness right after he plays, but he ices his knee which resolves any ache or soreness. He does report he has been playing basketball at 100% but will sometimes wear compression pants to make his joints feel better.    Patient Stated Goals Improve knee pain and strength to return to basketball    Currently in Pain? No/denies                [facility] PT Assessment - 05/01/21 0001       AROM   Right Knee Extension 0    Right Ankle Dorsiflexion 5    Left Ankle Dorsiflexion 8      Strength   Right Hip Extension 4/5    Right Hip ABduction 4/5    Left Hip Extension 4+/5    Left Hip ABduction 4+/5    Right Knee Flexion 5/5    Right Knee Extension 5/5    Left Knee Flexion 5/5    Left Knee Extension 5/5    Right Ankle Dorsiflexion 5/5    Right Ankle Plantar Flexion 5/5    Left Ankle Dorsiflexion 5/5    Left Ankle Plantar Flexion 5/5      Flexibility   Soft Tissue Assessment /Muscle Length yes   Patient exhibits gastrocsoleus tightness on right   Hamstrings Patient exhibits slight limitation on right  Quadriceps WFL, equal bilaterally      Palpation   Patella mobility WFL                           [facility] Adult PT Treatment/Exercise - 05/01/21 0001       Exercises   Exercises Knee/Hip   review of current HEP     Knee/Hip Exercises: Stretches   Passive Hamstring Stretch 3 reps;60 seconds    Passive Hamstring Stretch Limitations supine with strap x 1, seated foot in chair with pressure over knee x 2    Quad Stretch 2 reps;60 seconds    Quad Stretch Limitations prone quad stretch    Gastroc Stretch 2 reps;60 seconds    Gastroc Stretch Limitations slant board    Other Knee/Hip Stretches Ankle DF MWM with band 3 x 10      Knee/Hip Exercises: Aerobic   Elliptical L5 R1 x 5 min while taking subjective      Modalities   Modalities Cryotherapy      Cryotherapy   Number Minutes Cryotherapy 10  Minutes    Cryotherapy Location Knee   right   Type of Cryotherapy Ice pack                    PT Education - 05/01/21 0921     Education Details Update POC and HEP    Person(s) Educated Patient    Methods Explanation;Demonstration;Verbal cues;Handout    Comprehension Verbalized understanding;Returned demonstration;Verbal cues required;Need further instruction              PT Short Term Goals - 05/01/21 0955       PT SHORT TERM GOAL #1   Title Patient will be I with initial HEP to progress with PT    Baseline patient I with initial HEP    Time 4    Period Weeks    Status Achieved    Target Date 03/26/21      PT SHORT TERM GOAL #2   Title Patient will be able to perform 10 reps SLR without extension lag or fatigue/difficulty    Baseline patient able to perform 10 SLR    Time 4    Period Weeks    Status Achieved    Target Date 03/26/21      PT SHORT TERM GOAL #3   Title Patient will exhibit 5 deg right knee ATKHE to improve quad and knee control    Baseline right knee 0 deg extension    Time 3    Period Weeks    Status On-going    Target Date 05/22/21      PT SHORT TERM GOAL #4   Title Patient will achieve 5 deg ankle DF on right to improve squat depth    Baseline 5 deg right ankle DF    Time 4    Period Weeks    Status Achieved    Target Date 03/26/21               PT Long Term Goals - 05/01/21 0953       PT LONG TERM GOAL #1   Title Patient will be I with final HEP to maintain progress from PT    Baseline progressing with HEP    Time 6    Period Weeks    Status On-going    Target Date 06/12/21      PT LONG TERM GOAL #2   Title Patient  will exhibit 5/5 MMT knee strength to improve tolerance for basketball    Baseline 5/5 knee MMT    Time 8    Period Weeks    Status Achieved      PT LONG TERM GOAL #3   Title Patient will exhibit >/= 4+/5 MMT hip strength to improve single leg control with squat to reduce dynamic knee valgus     Baseline 4/5 right hip  MMT    Time 6    Period Weeks    Status On-going    Target Date 06/12/21      PT LONG TERM GOAL #4   Title Patient will report no limitation with consecutive days of basketball training to improve functional ability    Baseline patient reports increased soreness with more consecutive days of basketball    Time 6    Period Weeks    Status On-going    Target Date 06/12/21      PT LONG TERM GOAL #5   Title Patient will achieve >/= 10 deg ankle DF to improve squat technique to reduce stress on anterior knee    Baseline right 5 deg ankle DF    Time 6    Period Weeks    Status On-going    Target Date 06/12/21                   Plan - 05/01/21 8280     Clinical Impression Statement Patient tolerated therapy well with no adverse effects. He reported that he just came from a workout so therapy focused on assessment for re-cert of POC and progressing mobility as he continues to exhibit right sided deficit compared to left. Patient is progressing toward goals and will continue therapy at 1x/week for 6 more weeks until he returns to school. 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 .    PT Frequency 1x / week    PT Duration 6 weeks    PT Treatment/Interventions ADLs/Self Care Home Management;Aquatic Therapy;Cryotherapy;Electrical Stimulation;Iontophoresis 59m/ml Dexamethasone;Moist Heat;Ultrasound;Neuromuscular re-education;Balance training;Therapeutic exercise;Therapeutic activities;Functional mobility training;Stair training;Gait training;Patient/family education;Manual techniques;Dry needling;Passive range of motion;Taping;Spinal Manipulations;Joint Manipulations     PT Next Visit Plan Review HEP and progress PRN, continue quad strengthening and improve active hyperextension, manual/stretching for quad and calf flexibility, hip strengthening (squat, step-up, lunge, deadlift, etc), initiate plyometric training    PT Home Exercise Plan [id number]   Consulted and Agree with Plan of Care Patient             Patient will benefit from skilled therapeutic intervention in order to improve the following deficits and impairments:  Decreased range of motion, Pain, Decreased activity tolerance, Decreased strength, Impaired flexibility, Improper body mechanics  Visit Diagnosis: Chronic pain of right knee  Muscle weakness (generalized)     Problem List Patient Active Problem List   Diagnosis Date Noted   Closed Salter-Harris type III physeal fracture of proximal end of tibia 12/14/2016   Closed Salter-Harris type III physeal fracture of proximal end of right tibia 12/14/2016    CHilda Blades PT, DPT, LAT, ATC 05/01/21  9:59 AM Phone: [phone]: 3[facility]GT[address] [place] [address]Phone: [phone]  Fax:  [phone]  Name: Benjamin Lam MRN: [id number] Date of Birth: May 30, 2001   PHYSICAL THERAPY DISCHARGE SUMMARY  Visits from Start of Care: 6  Current functional level related to goals / functional outcomes: See above   Remaining deficits: See above   Education / Equipment: HEP   Patient agrees to discharge. Patient goals were partially met. Patient is being discharged due to not returning since the last visit.

## 2021-05-01 NOTE — Patient Instructions (Signed)
Access Code: [email or web address] URL: [email or web address] Date: 05/01/2021 Prepared by: Rosana Hoes  Exercises Active Straight Leg Raise with Quad Set - 1 x daily - 4 x weekly - 3 sets - 10 reps Standing Terminal Knee Extension with Resistance - 1 x daily - 4 x weekly - 3 sets - 10 reps Goblet Squat with Kettlebell - 1 x daily - 4 x weekly - 3 sets - 10 reps Side Stepping with Resistance at Thighs - 1 x daily - 4 x weekly - 3 sets - 20 reps Single Leg Lunge with Foot on Bench - 1 x daily - 4 x weekly - 3-4 sets - 6-8 reps Prone Quadriceps Stretch with Strap - 2 x daily - 7 x weekly - 3 reps - 30 hold Supine Hamstring Stretch with Strap - 1-2 x daily - 7 x weekly - 2 sets - 60 hold Seated Hamstring Stretch with Chair - 1-2 x daily - 7 x weekly - 2-3 sets - 60 reps Gastroc Stretch on Wall - 2 x daily - 7 x weekly - 3 reps - 30 hold Standing Ankle Dorsiflexion Stretch on Chair - 1-2 x daily - 7 x weekly - 3 sets - 10 reps

## 2021-05-08 ENCOUNTER — Ambulatory Visit: Payer: Medicaid Other | Admitting: Physical Therapy

## 2021-05-10 ENCOUNTER — Telehealth: Payer: Self-pay | Admitting: Physical Therapy

## 2021-05-10 NOTE — Telephone Encounter (Signed)
Attempted to contact patient due to missed PT appointment on 05/08/2021. No answer and unable to leave VM.  Rosana Hoes, PT, DPT, LAT, ATC 05/10/21  2:06 PM Phone: [phone] Fax: [phone]

## 2021-05-17 ENCOUNTER — Encounter: Payer: Self-pay | Admitting: Family Medicine

## 2021-05-17 ENCOUNTER — Ambulatory Visit (INDEPENDENT_AMBULATORY_CARE_PROVIDER_SITE_OTHER): Payer: Medicaid Other | Admitting: Family Medicine

## 2021-05-17 VITALS — BP 108/74 | Ht 77.0 in | Wt 190.0 lb

## 2021-05-17 DIAGNOSIS — M722 Plantar fascial fibromatosis: Secondary | ICD-10-CM

## 2021-05-17 DIAGNOSIS — M2141 Flat foot [pes planus] (acquired), right foot: Secondary | ICD-10-CM | POA: Diagnosis not present

## 2021-05-17 DIAGNOSIS — M2142 Flat foot [pes planus] (acquired), left foot: Secondary | ICD-10-CM

## 2021-05-17 NOTE — Progress Notes (Signed)
PCP: Cliffton Asters, PA-C Consultation requested by Dr. Eulah Pont Subjective:   HPI: Patient is a 20 y.o. male here for custom orthotics.  Patient referred from Dr. Eulah Pont for new custom orthotics. 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 Given orthotics previously for flat feet at different facility but no injury or concerns when first given these. He has had about 2-3 weeks of right arch, plantar heel pain. Worse first thing in the morning.  Past Medical History:  Diagnosis Date   Closed Salter-Harris type III physeal fracture of proximal end of tibia 12/14/2016    Current Outpatient Medications on File Prior to Visit  Medication Sig Dispense Refill   Calcium-Vitamins C & D (CALCIUM/C/D PO) Take by mouth.     ibuprofen (ADVIL,MOTRIN) 600 MG tablet Take 1 tablet (600 mg total) by mouth every 8 (eight) hours as needed for moderate pain. 30 tablet 0   polyethylene glycol (MIRALAX) 17 g packet Take 17 g by mouth daily as needed for severe constipation. 5 each 0   No current facility-administered medications on file prior to visit.    Past Surgical History:  Procedure Laterality Date   CIRCUMCISION     FASCIOTOMY Right 12/14/2016   Procedure: Anterior compression FASCIOTOMY;  Surgeon: Sheral Apley, MD;  Location:  [facility] OR;  Service: Orthopedics;  Laterality: Right;   HARDWARE REMOVAL Right 06/05/2017   Procedure: HARDWARE REMOVAL, RIGHT PROXIMAL TIBIA SCREW REMOVAL;  Surgeon: Sheral Apley, MD;  Location: [facility];  Service: Orthopedics;  Laterality: Right;   ORIF TIBIA PLATEAU Right 12/14/2016   Procedure: LEG PERCANTANEOUS PROXIMAL TIBIAL FRACTURE;  Surgeon: Sheral Apley, MD;  [facility];  Service: Orthopedics;  Laterality: Right;   PATELLAR TENDON REPAIR  12/14/2016   Procedure: PATELLA TENDON REPAIR;  Surgeon: Sheral Apley, MD;  Location: [facility] OR;  Service: Orthopedics;;    No Known Allergies  Social History   Socioeconomic History   Marital status:  Single    Spouse name: Not on file   Number of children: Not on file   Years of education: Not on file   Highest education level: Not on file  Occupational History   Not on file  Tobacco Use   Smoking status: Never   Smokeless tobacco: Never  Vaping Use   Vaping Use: Never used  Substance and Sexual Activity   Alcohol use: No   Drug use: No   Sexual activity: Never    Birth control/protection: Abstinence  Other Topics Concern   Not on file  Social History Narrative   Not on file   Social Determinants of Health   Financial Resource Strain: Not on file  Food Insecurity: Not on file  Transportation Needs: Not on file  Physical Activity: Not on file  Stress: Not on file  Social Connections: Not on file  Intimate Partner Violence: Not on file    Family History  Problem Relation Age of Onset   Diabetes Maternal Grandmother    Hypertension Maternal Grandmother    Hypertension Paternal Grandmother    Healthy Mother    Healthy Father     BP 108/74   Ht 6\' 5"  (1.956 m)   Wt 190 lb (86.2 kg)   BMI 22.53 kg/m   No flowsheet data found.  No flowsheet data found.  Review of Systems: See HPI above.     Objective:  Physical Exam:  Gen: NAD, comfortable in exam room  Right foot/ankle: Pes planus.  Transverse arch preserved. No other gross  deformity, swelling, ecchymoses FROM without pain TTP proximal long arch and at plantar fascia insertion on calcaneus. Negative ant drawer and negative talar tilt.   Negative calcaneal squeeze. Post tib function noted with calf raie. NV intact distally.  Pronation on gait is improved with orthotics   Assessment & Plan:  1. Pes planus with right plantar fasciitis - new custom orthotics made today and were comfortable to patient.  F/u prn.  Patient was fitted for a : standard, cushioned, semi-rigid orthotic. The orthotic was heated and afterward the patient stood on the orthotic blank positioned on the orthotic stand. The  patient was positioned in subtalar neutral position and 10 degrees of ankle dorsiflexion in a weight bearing stance. After completion of molding, a stable base was applied to the orthotic blank. The blank was ground to a stable position for weight bearing. Size: 14 Base: blue med density eva Posting: none Additional orthotic padding: none

## 2021-05-22 ENCOUNTER — Encounter: Payer: Medicaid Other | Admitting: Sports Medicine

## 2022-04-24 ENCOUNTER — Encounter (HOSPITAL_COMMUNITY): Payer: Self-pay

## 2022-04-24 ENCOUNTER — Emergency Department (HOSPITAL_COMMUNITY)
Admission: EM | Admit: 2022-04-24 | Discharge: 2022-04-24 | Disposition: A | Discharge status: Home / Self Care | Payer: BC Managed Care – PPO | Attending: Emergency Medicine | Admitting: Emergency Medicine

## 2022-04-24 ENCOUNTER — Other Ambulatory Visit: Payer: Self-pay

## 2022-04-24 ENCOUNTER — Ambulatory Visit (HOSPITAL_COMMUNITY)
Admission: EM | Admit: 2022-04-24 | Discharge: 2022-04-24 | Disposition: A | Discharge status: Other Acute Inpatient Hospital | Payer: BC Managed Care – PPO | Attending: Internal Medicine | Admitting: Internal Medicine

## 2022-04-24 ENCOUNTER — Emergency Department (HOSPITAL_COMMUNITY): Payer: BC Managed Care – PPO

## 2022-04-24 DIAGNOSIS — Z5321 Procedure and treatment not carried out due to patient leaving prior to being seen by health care provider: Secondary | ICD-10-CM | POA: Diagnosis not present

## 2022-04-24 DIAGNOSIS — R42 Dizziness and giddiness: Secondary | ICD-10-CM | POA: Insufficient documentation

## 2022-04-24 DIAGNOSIS — R079 Chest pain, unspecified: Secondary | ICD-10-CM

## 2022-04-24 DIAGNOSIS — R0602 Shortness of breath: Secondary | ICD-10-CM | POA: Diagnosis not present

## 2022-04-24 DIAGNOSIS — R110 Nausea: Secondary | ICD-10-CM | POA: Insufficient documentation

## 2022-04-24 DIAGNOSIS — R0789 Other chest pain: Secondary | ICD-10-CM | POA: Diagnosis not present

## 2022-04-24 LAB — CBC WITH DIFFERENTIAL/PLATELET
Abs Immature Granulocytes: 0.04 10*3/uL (ref 0.00–0.07)
Basophils Absolute: 0.1 10*3/uL (ref 0.0–0.1)
Basophils Relative: 1 %
Eosinophils Absolute: 0.3 10*3/uL (ref 0.0–0.5)
Eosinophils Relative: 4 %
HCT: 40.1 % (ref 39.0–52.0)
Hemoglobin: 13.4 g/dL (ref 13.0–17.0)
Immature Granulocytes: 1 %
Lymphocytes Relative: 32 %
Lymphs Abs: 2.6 10*3/uL (ref 0.7–4.0)
MCH: 30.6 pg (ref 26.0–34.0)
MCHC: 33.4 g/dL (ref 30.0–36.0)
MCV: 91.6 fL (ref 80.0–100.0)
Monocytes Absolute: 0.6 10*3/uL (ref 0.1–1.0)
Monocytes Relative: 8 %
Neutro Abs: 4.4 10*3/uL (ref 1.7–7.7)
Neutrophils Relative %: 54 %
Platelets: 220 10*3/uL (ref 150–400)
RBC: 4.38 MIL/uL (ref 4.22–5.81)
RDW: 11.5 % (ref 11.5–15.5)
WBC: 8 10*3/uL (ref 4.0–10.5)
nRBC: 0 % (ref 0.0–0.2)

## 2022-04-24 LAB — COMPREHENSIVE METABOLIC PANEL
ALT: 20 U/L (ref 0–44)
AST: 24 U/L (ref 15–41)
Albumin: 4.3 g/dL (ref 3.5–5.0)
Alkaline Phosphatase: 56 U/L (ref 38–126)
Anion gap: 12 (ref 5–15)
BUN: 7 mg/dL (ref 6–20)
CO2: 24 mmol/L (ref 22–32)
Calcium: 9.3 mg/dL (ref 8.9–10.3)
Chloride: 102 mmol/L (ref 98–111)
Creatinine, Ser: 0.91 mg/dL (ref 0.61–1.24)
GFR, Estimated: >60 mL/min (ref >60)
Glucose, Bld: 152 mg/dL — ABNORMAL HIGH (ref 70–99)
Potassium: 3.1 mmol/L — ABNORMAL LOW (ref 3.5–5.1)
Sodium: 138 mmol/L (ref 135–145)
Total Bilirubin: 0.6 mg/dL (ref 0.3–1.2)
Total Protein: 7.6 g/dL (ref 6.5–8.1)

## 2022-04-24 LAB — TROPONIN I (HIGH SENSITIVITY): Troponin I (High Sensitivity): 2 ng/L (ref <18)

## 2022-04-24 MED ORDER — ASPIRIN 81 MG PO CHEW
CHEWABLE_TABLET | ORAL | Status: AC
  Filled 2022-04-24: qty 1

## 2022-04-24 MED ORDER — SODIUM CHLORIDE 0.9 % IV SOLN: INTRAVENOUS | Status: DC

## 2022-04-24 MED ORDER — ASPIRIN 81 MG PO CHEW
324.0000 mg | CHEWABLE_TABLET | Freq: Once | ORAL | Status: AC
  Administered 2022-04-24: 324 mg via ORAL

## 2022-04-24 NOTE — ED Triage Notes (Signed)
Sent by UC for chest pain x 2 days.  Reports t wave abnormality.  Also reports smoking weed prior to arrival.  Received ASA at [facility].

## 2022-04-24 NOTE — ED Provider Triage Note (Signed)
Emergency Medicine Provider Triage Evaluation Note  Lucca Ballo , a 21 y.o. male  was evaluated in triage.  Pt complains of chest pain.  Chest pain has been present over the last 2 days.  Patient reports that the pain became worse today after he smoked marijuana.  Pain is located to his mid sternum and does not radiate.  Patient describes pain as a tightness.  Patient does endorse associated nausea, lightheadedness and shortness of breath.  Review of Systems  Positive: Chest pain, nausea, lightheadedness, shortness of breath Negative: Syncope, leg swelling or tenderness, hemoptysis, palpitations  Physical Exam  BP 134/78 (BP Location: Right Arm)   Pulse 91   Temp 98.4 F (36.9 C) (Oral)   Resp 16   Ht 6\' 5"  (1.956 m)   Wt 86.2 kg   SpO2 100%   BMI 22.53 kg/m  Gen:   Awake, no distress   Resp:  Normal effort, clear to auscultation bilaterally.  Speaks in full complete sentences without difficulty. MSK:   Moves extremities without difficulty  Other:    Medical Decision Making  Medically screening exam initiated at 4:06 PM.  Appropriate orders placed.  Artavius Stearns was informed that the remainder of the evaluation will be completed by another provider, this initial triage assessment does not replace that evaluation, and the importance of remaining in the ED until their evaluation is complete.  Patient was given aspirin by urgent care.  ACS work-up initiated.   [name], Haskel Schroeder 04/24/22 1607

## 2022-04-24 NOTE — ED Provider Notes (Signed)
[facility]    CSN: [id number] Arrival date & time: 04/24/22  1504      History   Chief Complaint Chief Complaint  Patient presents with   Chest Pain    HPI Benjamin Lam is a 21 y.o. male. Pt presents with c/o chest pain and tachycardia x 1-2 hours. Pt endorses smoking marijuana 2 hours ago.   Describes chest pain as central substernal pain that feels like a tightness or pressure.  Reports feeling anxious, nauseated, dizzy.   Chest Pain   Past Medical History:  Diagnosis Date   Closed Salter-Harris type III physeal fracture of proximal end of tibia 12/14/2016    Patient Active Problem List   Diagnosis Date Noted   Closed Salter-Harris type III physeal fracture of proximal end of tibia 12/14/2016   Closed Salter-Harris type III physeal fracture of proximal end of right tibia 12/14/2016    Past Surgical History:  Procedure Laterality Date   CIRCUMCISION     FASCIOTOMY Right 12/14/2016   Procedure: Anterior compression FASCIOTOMY;  Surgeon: Sheral Apley, MD;  Location: [facility] OR;  Service: Orthopedics;  Laterality: Right;   HARDWARE REMOVAL Right 06/05/2017   Procedure: HARDWARE REMOVAL, RIGHT PROXIMAL TIBIA SCREW REMOVAL;  Surgeon: Sheral Apley, MD;  Location: [facility];  Service: Orthopedics;  Laterality: Right;   ORIF TIBIA PLATEAU Right 12/14/2016   Procedure: LEG PERCANTANEOUS PROXIMAL TIBIAL FRACTURE;  Surgeon: Sheral Apley, MD;  Location[facility];  Service: Orthopedics;  Laterality: Right;   PATELLAR TENDON REPAIR  12/14/2016   Procedure: PATELLA TENDON REPAIR;  Surgeon: Sheral Apley, MD;  Location[facility];  Service: Orthopedics;;       Home Medications    Prior to Admission medications   Medication Sig Start Date End Date Taking? Authorizing Provider  Calcium-Vitamins C & D (CALCIUM/C/D PO) Take by mouth. Patient not taking: Reported on 04/24/2022    [provider]  ibuprofen (ADVIL,MOTRIN) 600 MG tablet Take 1  tablet (600 mg total) by mouth every 8 (eight) hours as needed for moderate pain. Patient not taking: Reported on 04/24/2022 06/05/17   Albina Billet III, PA-C  polyethylene glycol (MIRALAX) 17 g packet Take 17 g by mouth daily as needed for severe constipation. Patient not taking: Reported on 04/24/2022 10/03/19   Wallis Bamberg, PA-C    Family History Family History  Problem Relation Age of Onset   Diabetes Maternal Grandmother    Hypertension Maternal Grandmother    Hypertension Paternal Grandmother    Healthy Mother    Healthy Father     Social History Social History   Tobacco Use   Smoking status: Never   Smokeless tobacco: Never  Vaping Use   Vaping Use: Never used  Substance Use Topics   Alcohol use: No   Drug use: Yes    Types: Marijuana     Allergies   Penicillins   Review of Systems Review of Systems  Cardiovascular:  Positive for chest pain.     Physical Exam Triage Vital Signs ED Triage Vitals  Enc Vitals Group     BP 04/24/22 1507 138/75     Pulse Rate 04/24/22 1507 89     Resp 04/24/22 1507 16     Temp 04/24/22 1507 98.1 F (36.7 C)     Temp Source 04/24/22 1507 Oral     SpO2 04/24/22 1507 100 %     Weight --      Height --  Head Circumference --      Peak Flow --      Pain Score 04/24/22 1508 10     Pain Loc --      Pain Edu? --      Excl. in GC? --    No data found.  Updated Vital Signs BP 138/75 (BP Location: Right Arm)   Pulse 89   Temp 98.1 F (36.7 C) (Oral)   Resp 16   SpO2 100%   Visual Acuity Right Eye Distance:   Left Eye Distance:   Bilateral Distance:    Right Eye Near:   Left Eye Near:    Bilateral Near:     Physical Exam Constitutional:      Appearance: He is well-developed.     Comments: Appears uncomfortable  Cardiovascular:     Rate and Rhythm: Normal rate and regular rhythm.  Pulmonary:     Effort: Pulmonary effort is normal.     Breath sounds: Normal breath sounds.  Chest:     Chest  wall: No tenderness.  Neurological:     Mental Status: He is alert.      UC Treatments / Results  Labs (all labs ordered are listed, but only abnormal results are displayed) Labs Reviewed - No data to display  EKG EKG: ischemic changes noted in  inferior leads - T wave inversion.   Radiology No results found.  Procedures Procedures (including critical care time)  Medications Ordered in UC Medications  0.9 %  sodium chloride infusion (has no administration in time range)  aspirin chewable tablet 324 mg (has no administration in time range)    Initial Impression / Assessment and Plan / UC Course  I have reviewed the triage vital signs and the nursing notes.  Pertinent labs & imaging results that were available during my care of the patient were reviewed by me and considered in my medical decision making (see chart for details).    Pt's symptoms are concerning for ACS, EKG abnormal. [facility] called. ASA chewable 324mg  given.   Final Clinical Impressions(s) / UC Diagnoses   Final diagnoses:  Chest pain, unspecified type   Discharge Instructions   None    ED Prescriptions   None    PDMP not reviewed this encounter.   , NP 04/24/22 [phone]

## 2022-04-24 NOTE — ED Notes (Signed)
Gave report to Consulting civil engineer in Emergency Department.

## 2022-04-24 NOTE — ED Notes (Signed)
Patient is being discharged from the Urgent Care and sent to the Emergency Department via [facility] . Per A Kabbe, patient is in need of higher level of care due to CP. Patient is aware and verbalizes understanding of plan of care.  Vitals:   04/24/22 1507  BP: 138/75  Pulse: 89  Resp: 16  Temp: 98.1 F (36.7 C)  SpO2: 100%

## 2022-04-24 NOTE — ED Triage Notes (Signed)
Pt presents with c/o chest pain and tachycardia x 1-2 hours. Pt endorses smoking marijuana 2 hours ago.

## 2022-04-24 NOTE — ED Notes (Signed)
Patient is being discharged from the Urgent Care and sent to the Emergency Department via [facility] . Per Marylene Land FNP, patient is in need of higher level of care due to Chest Pain and T-wave abnormality. Patient is aware and verbalizes understanding of plan of care.  Vitals:   04/24/22 1507  BP: 138/75  Pulse: 89  Resp: 16  Temp: 98.1 F (36.7 C)  SpO2: 100%

## 2022-04-26 ENCOUNTER — Emergency Department (HOSPITAL_BASED_OUTPATIENT_CLINIC_OR_DEPARTMENT_OTHER): Payer: BC Managed Care – PPO | Admitting: Radiology

## 2022-04-26 ENCOUNTER — Encounter (HOSPITAL_BASED_OUTPATIENT_CLINIC_OR_DEPARTMENT_OTHER): Payer: Self-pay | Admitting: Emergency Medicine

## 2022-04-26 ENCOUNTER — Emergency Department (HOSPITAL_BASED_OUTPATIENT_CLINIC_OR_DEPARTMENT_OTHER)
Admission: EM | Admit: 2022-04-26 | Discharge: 2022-04-27 | Disposition: A | Discharge status: Home / Self Care | Payer: BC Managed Care – PPO | Attending: Emergency Medicine | Admitting: Emergency Medicine

## 2022-04-26 ENCOUNTER — Other Ambulatory Visit: Payer: Self-pay

## 2022-04-26 DIAGNOSIS — R0602 Shortness of breath: Secondary | ICD-10-CM | POA: Diagnosis not present

## 2022-04-26 DIAGNOSIS — J45901 Unspecified asthma with (acute) exacerbation: Secondary | ICD-10-CM

## 2022-04-26 DIAGNOSIS — F419 Anxiety disorder, unspecified: Secondary | ICD-10-CM | POA: Diagnosis not present

## 2022-04-26 DIAGNOSIS — R0789 Other chest pain: Secondary | ICD-10-CM | POA: Diagnosis not present

## 2022-04-26 DIAGNOSIS — R079 Chest pain, unspecified: Secondary | ICD-10-CM

## 2022-04-26 LAB — CBC
HCT: 46.1 % (ref 39.0–52.0)
Hemoglobin: 15.7 g/dL (ref 13.0–17.0)
MCH: 31 pg (ref 26.0–34.0)
MCHC: 34.1 g/dL (ref 30.0–36.0)
MCV: 90.9 fL (ref 80.0–100.0)
Platelets: 231 10*3/uL (ref 150–400)
RBC: 5.07 MIL/uL (ref 4.22–5.81)
RDW: 11.6 % (ref 11.5–15.5)
WBC: 7.7 10*3/uL (ref 4.0–10.5)
nRBC: 0 % (ref 0.0–0.2)

## 2022-04-26 LAB — BASIC METABOLIC PANEL
Anion gap: 12 (ref 5–15)
BUN: 5 mg/dL — ABNORMAL LOW (ref 6–20)
CO2: 26 mmol/L (ref 22–32)
Calcium: 11.1 mg/dL — ABNORMAL HIGH (ref 8.9–10.3)
Chloride: 100 mmol/L (ref 98–111)
Creatinine, Ser: 0.89 mg/dL (ref 0.61–1.24)
GFR, Estimated: >60 mL/min (ref >60)
Glucose, Bld: 84 mg/dL (ref 70–99)
Potassium: 3.6 mmol/L (ref 3.5–5.1)
Sodium: 138 mmol/L (ref 135–145)

## 2022-04-26 LAB — TROPONIN I (HIGH SENSITIVITY): Troponin I (High Sensitivity): 2 ng/L (ref <18)

## 2022-04-26 LAB — D-DIMER, QUANTITATIVE: D-Dimer, Quant: <0.27 ug/mL-FEU (ref 0.00–0.50)

## 2022-04-26 MED ORDER — HYDROXYZINE HCL 25 MG PO TABS
25.0000 mg | ORAL_TABLET | Freq: Four times a day (QID) | ORAL | 0 refills | Status: AC
Start: 2022-04-26 — End: 2022-05-10

## 2022-04-26 MED ORDER — IPRATROPIUM-ALBUTEROL 0.5-2.5 (3) MG/3ML IN SOLN
3.0000 mL | Freq: Once | RESPIRATORY_TRACT | Status: AC
  Administered 2022-04-26: 3 mL via RESPIRATORY_TRACT
  Filled 2022-04-26: qty 3

## 2022-04-26 MED ORDER — ALBUTEROL SULFATE HFA 108 (90 BASE) MCG/ACT IN AERS
2.0000 | INHALATION_SPRAY | RESPIRATORY_TRACT | Status: DC | PRN
  Administered 2022-04-26: 2 via RESPIRATORY_TRACT
  Filled 2022-04-26: qty 6.7

## 2022-04-26 MED ORDER — AEROCHAMBER PLUS FLO-VU MISC
1.0000 | Freq: Once | Status: AC
Start: 2022-04-26 — End: 2022-04-26
  Administered 2022-04-26: 1
  Filled 2022-04-26: qty 1

## 2022-04-26 NOTE — ED Provider Notes (Signed)
[facility] EMERGENCY DEPT Provider Note   CSN: [id number] Arrival date & time: 04/26/22  1842     History  Chief Complaint  Patient presents with   Chest Pain    Benjamin Lam is a 21 y.o. male.   Chest Pain  Patient is a 21 year old male presented to the emergency room today with complaints of chest pain seems that his pain has been ongoing for the past 3 days.  He states that it feels like a pressure all around his chest but he has had episodes of sharp chest pain that was left-sided.  Denies any pleuritic component to his pain.  Denies any exertional pain.  He states that when he walks that he does feel that he is short of breath and also feels his heart racing.  He states he does smoke marijuana occasionally although he has not smoked since 2 days ago because he states that it caused his symptoms to worsen.  He states he feels somewhat anxious and nauseous and fatigued.  No recent surgeries, hospitalization, long travel, hemoptysis, estrogen containing OCP, cancer history.  No unilateral leg swelling.  No history of PE or VTE.      Home Medications Prior to Admission medications   Medication Sig Start Date End Date Taking? Authorizing Provider  Calcium-Vitamins C & D (CALCIUM/C/D PO) Take by mouth. Patient not taking: Reported on 04/24/2022    [provider]  ibuprofen (ADVIL,MOTRIN) 600 MG tablet Take 1 tablet (600 mg total) by mouth every 8 (eight) hours as needed for moderate pain. Patient not taking: Reported on 04/24/2022 06/05/17   Albina Billet III, PA-C  polyethylene glycol (MIRALAX) 17 g packet Take 17 g by mouth daily as needed for severe constipation. Patient not taking: Reported on 04/24/2022 10/03/19   Wallis Bamberg, PA-C      Allergies    Penicillins    Review of Systems   Review of Systems  Cardiovascular:  Positive for chest pain.    Physical Exam Updated Vital Signs BP 133/85 (BP Location: Right Arm)   Pulse 64    Temp 98.4 F (36.9 C)   Resp 10   SpO2 100%  Physical Exam Vitals and nursing note reviewed.  Constitutional:      General: He is not in acute distress.    Comments: Slightly anxious appearing 21 year old male no acute distress  HENT:     Head: Normocephalic and atraumatic.     Nose: Nose normal.     Mouth/Throat:     Mouth: Mucous membranes are moist.  Eyes:     General: No scleral icterus. Cardiovascular:     Rate and Rhythm: Normal rate and regular rhythm.     Pulses: Normal pulses.     Heart sounds: Normal heart sounds.  Pulmonary:     Effort: Pulmonary effort is normal. No respiratory distress.     Breath sounds: No wheezing.  Abdominal:     Palpations: Abdomen is soft.     Tenderness: There is no abdominal tenderness. There is no guarding or rebound.  Musculoskeletal:     Cervical back: Normal range of motion.     Right lower leg: No edema.     Left lower leg: No edema.  Skin:    General: Skin is warm and dry.     Capillary Refill: Capillary refill takes less than 2 seconds.  Neurological:     Mental Status: He is alert. Mental status is at baseline.  Psychiatric:  Mood and Affect: Mood normal.        Behavior: Behavior normal.     ED Results / Procedures / Treatments   Labs (all labs ordered are listed, but only abnormal results are displayed) Labs Reviewed  CBC  BASIC METABOLIC PANEL  D-DIMER, QUANTITATIVE  TROPONIN I (HIGH SENSITIVITY)  TROPONIN I (HIGH SENSITIVITY)    EKG EKG Interpretation  Date/Time:  Friday April 26 2022 18:50:28 EDT Ventricular Rate:  80 PR Interval:  136 QRS Duration: 90 QT Interval:  358 QTC Calculation: 412 R Axis:   77 Text Interpretation: Normal sinus rhythm with sinus arrhythmia T wave abnormality, consider inferolateral ischemia Abnormal ECG When compared with ECG of 24-Apr-2022 16:04, No significant change was found Confirmed by Vanetta Mulders [phone]) on 04/26/2022 9:27:14 PM  Radiology DG Chest 2  View  Result Date: 04/26/2022 CLINICAL DATA:  Chest pain, shortness of breath EXAM: CHEST - 2 VIEW COMPARISON:  04/24/2022 FINDINGS: The heart size and mediastinal contours are within normal limits. Both lungs are clear. The visualized skeletal structures are unremarkable. IMPRESSION: No active cardiopulmonary disease. Electronically Signed   By: Ernie Avena M.D.   On: 04/26/2022 19:05    Procedures Procedures    Medications Ordered in ED Medications - No data to display  ED Course/ Medical Decision Making/ A&P                           Medical Decision Making Amount and/or Complexity of Data Reviewed Labs: ordered. Radiology: ordered.   This patient presents to the ED for concern of chest pain/SOB, this involves a number of treatment options, and is a complaint that carries with it a moderate/high risk of complications and morbidity.  The differential diagnosis includes The emergent causes of chest pain include: Acute coronary syndrome, tamponade, pericarditis/myocarditis, aortic dissection, pulmonary embolism, tension pneumothorax, pneumonia, and esophageal rupture.   I do not believe the patient has an emergent cause of chest pain, other urgent/non-acute considerations include, but are not limited to: chronic angina, aortic stenosis, cardiomyopathy, mitral valve prolapse, pulmonary hypertension, aortic insufficiency, right ventricular hypertrophy, pleuritis, bronchitis, pneumothorax, tumor, gastroesophageal reflux disease (GERD), esophageal spasm, Mallory-Weiss syndrome, peptic ulcer disease, pancreatitis, functional gastrointestinal pain, cervical or thoracic disk disease or arthritis, shoulder arthritis, costochondritis, subacromial bursitis, anxiety or panic attack, herpes zoster, breast disorders, chest wall tumors, thoracic outlet syndrome, mediastinitis.    Co morbidities: Discussed in HPI   Brief History:  Patient is a 21 year old male presented to the emergency room  today with complaints of chest pain seems that his pain has been ongoing for the past 3 days.  He states that it feels like a pressure all around his chest but he has had episodes of sharp chest pain that was left-sided.  Denies any pleuritic component to his pain.  Denies any exertional pain.  He states that when he walks that he does feel that he is short of breath and also feels his heart racing.  He states he does smoke marijuana occasionally although he has not smoked since 2 days ago because he states that it caused his symptoms to worsen.  He states he feels somewhat anxious and nauseous and fatigued.  No recent surgeries, hospitalization, long travel, hemoptysis, estrogen containing OCP, cancer history.  No unilateral leg swelling.  No history of PE or VTE.     PE unremarkable    EMR reviewed including pt PMHx, past surgical history and past visits to  ER.   See HPI for more details   Lab Tests:   I ordered and independently interpreted labs. Labs notable for CBC unremarkable. Dimer undetectable.    Imaging Studies:  NAD. I personally reviewed all imaging studies and no acute abnormality found. I agree with radiology interpretation. IMPRESSION:  No active cardiopulmonary disease.      Electronically Signed    By: Ernie Avena M.D.    On: 04/26/2022 19:05     Cardiac Monitoring:  The patient was maintained on a cardiac monitor.  I personally viewed and interpreted the cardiac monitored which showed an underlying rhythm of: NSR EKG non-ischemic Some T-wave inversions (similar to prior EKGs) present   Medicines ordered:  No symptoms currently.   Critical Interventions:     Consults/Attending Physician   I discussed this case with my attending physician who cosigned this note including patient's presenting symptoms, physical exam, and planned diagnostics and interventions. Attending physician stated agreement with plan or made changes to plan which were  implemented.   Reevaluation:  After the interventions noted above I re-evaluated patient and found that they have :stayed the same   Social Determinants of Health:      Problem List / ED Course:  Chest pain -very possibly anxiety related or perhaps due to marijuana/caffeine.  However patient palpitations, occasional sharp chest pains.  Will obtain D-dimer.  I suspect patient likely does not have a PE given his negative D-dimer.  He is low risk by [place].  We will hold off on additional testing for PE.  Patient care handed off to Dr. Deretha Emory for follow-up on troponin and BMP.  Anticipate discharge home after   Dispostion:    Final Clinical Impression(s) / ED Diagnoses Final diagnoses:  Chest pain, unspecified type  Anxiety    Rx / DC Orders ED Discharge Orders     None         Gailen Shelter, [place] 04/26/22 2228    Vanetta Mulders, MD 04/26/22 2313

## 2022-04-26 NOTE — Discharge Instructions (Addendum)
Your work-up today was quite reassuring. Given your results here and the symptoms you are having I think a viral neck step will be following up with a primary care provider.  Certainly there could be an element of anxiety related to your symptoms.  I recommend following up with your primary care doctor, taking hydroxyzine as needed for anxiety/heart palpitations and return to the emergency room for any other new or concerning symptoms.  Please refrain from marijuana energy drinks.  Use the albuterol inhaler 2 puffs every 6 hours.

## 2022-04-26 NOTE — ED Notes (Signed)
RT walked pt on pulse ox with sats maintained at 97% inside ER. Pt stated he gets SOB when outside walking. RT walked pt outside, pts sats dropped to 91% and he began to have shortness of breath. RT will continue to monitor.

## 2022-04-26 NOTE — ED Triage Notes (Signed)
Benjamin Lam had tightness in chest. Seen at [facility] and [place], left before being seen. Mom saw labs on my chart and gave 324 ASA and potassium supplement. Pain in mid upper chest by collarbone, sob when outside.  Took 3-81 mg asa around 2pm Newly started drinking energy drinks.

## 2022-04-27 NOTE — ED Notes (Signed)
RT educated pt on proper use of MDI w/spacer. Pt also given information on pulmonologist for evaluation. Pt able to perform w/out difficulty. Pt verbalizes understanding of medication administration and teaching.

## 2022-05-03 DIAGNOSIS — Z87898 Personal history of other specified conditions: Secondary | ICD-10-CM | POA: Diagnosis not present

## 2022-05-03 DIAGNOSIS — F411 Generalized anxiety disorder: Secondary | ICD-10-CM | POA: Diagnosis not present

## 2022-05-14 ENCOUNTER — Institutional Professional Consult (permissible substitution): Payer: BC Managed Care – PPO | Admitting: Pulmonary Disease

## 2022-05-21 DIAGNOSIS — Z0000 Encounter for general adult medical examination without abnormal findings: Secondary | ICD-10-CM | POA: Diagnosis not present

## 2022-05-21 DIAGNOSIS — Z13220 Encounter for screening for lipoid disorders: Secondary | ICD-10-CM | POA: Diagnosis not present

## 2022-05-21 DIAGNOSIS — F411 Generalized anxiety disorder: Secondary | ICD-10-CM | POA: Diagnosis not present

## 2022-07-18 DIAGNOSIS — J101 Influenza due to other identified influenza virus with other respiratory manifestations: Secondary | ICD-10-CM | POA: Diagnosis not present

## 2022-08-02 DIAGNOSIS — R1032 Left lower quadrant pain: Secondary | ICD-10-CM | POA: Diagnosis not present

## 2022-08-02 DIAGNOSIS — K5792 Diverticulitis of intestine, part unspecified, without perforation or abscess without bleeding: Secondary | ICD-10-CM | POA: Diagnosis not present

## 2022-08-05 DIAGNOSIS — A549 Gonococcal infection, unspecified: Secondary | ICD-10-CM | POA: Diagnosis not present

## 2022-08-15 DIAGNOSIS — R6889 Other general symptoms and signs: Secondary | ICD-10-CM | POA: Diagnosis not present

## 2022-08-15 DIAGNOSIS — J029 Acute pharyngitis, unspecified: Secondary | ICD-10-CM | POA: Diagnosis not present

## 2022-08-15 DIAGNOSIS — R509 Fever, unspecified: Secondary | ICD-10-CM | POA: Diagnosis not present

## 2022-08-16 DIAGNOSIS — R509 Fever, unspecified: Secondary | ICD-10-CM | POA: Diagnosis not present

## 2022-08-16 DIAGNOSIS — J029 Acute pharyngitis, unspecified: Secondary | ICD-10-CM | POA: Diagnosis not present

## 2022-08-16 DIAGNOSIS — Z20828 Contact with and (suspected) exposure to other viral communicable diseases: Secondary | ICD-10-CM | POA: Diagnosis not present

## 2022-08-16 DIAGNOSIS — R6889 Other general symptoms and signs: Secondary | ICD-10-CM | POA: Diagnosis not present

## 2022-11-06 DIAGNOSIS — M5412 Radiculopathy, cervical region: Secondary | ICD-10-CM | POA: Diagnosis not present

## 2022-11-27 DIAGNOSIS — Z113 Encounter for screening for infections with a predominantly sexual mode of transmission: Secondary | ICD-10-CM | POA: Diagnosis not present

## 2022-11-30 DIAGNOSIS — R079 Chest pain, unspecified: Secondary | ICD-10-CM | POA: Diagnosis not present

## 2022-11-30 DIAGNOSIS — M546 Pain in thoracic spine: Secondary | ICD-10-CM | POA: Diagnosis not present

## 2022-11-30 DIAGNOSIS — M549 Dorsalgia, unspecified: Secondary | ICD-10-CM | POA: Diagnosis not present

## 2022-11-30 DIAGNOSIS — R091 Pleurisy: Secondary | ICD-10-CM | POA: Diagnosis not present

## 2023-05-27 DIAGNOSIS — Z23 Encounter for immunization: Secondary | ICD-10-CM | POA: Diagnosis not present

## 2023-05-27 DIAGNOSIS — Z0000 Encounter for general adult medical examination without abnormal findings: Secondary | ICD-10-CM | POA: Diagnosis not present

## 2023-06-02 DIAGNOSIS — M25572 Pain in left ankle and joints of left foot: Secondary | ICD-10-CM | POA: Diagnosis not present

## 2023-06-11 DIAGNOSIS — S86212D Strain of muscle(s) and tendon(s) of anterior muscle group at lower leg level, left leg, subsequent encounter: Secondary | ICD-10-CM | POA: Diagnosis not present

## 2023-06-11 DIAGNOSIS — R262 Difficulty in walking, not elsewhere classified: Secondary | ICD-10-CM | POA: Diagnosis not present

## 2023-06-14 DIAGNOSIS — B356 Tinea cruris: Secondary | ICD-10-CM | POA: Diagnosis not present

## 2023-07-20 ENCOUNTER — Emergency Department (HOSPITAL_BASED_OUTPATIENT_CLINIC_OR_DEPARTMENT_OTHER): Payer: BC Managed Care – PPO | Admitting: Radiology

## 2023-07-20 ENCOUNTER — Other Ambulatory Visit: Payer: Self-pay

## 2023-07-20 ENCOUNTER — Encounter (HOSPITAL_BASED_OUTPATIENT_CLINIC_OR_DEPARTMENT_OTHER): Payer: Self-pay

## 2023-07-20 ENCOUNTER — Emergency Department (HOSPITAL_BASED_OUTPATIENT_CLINIC_OR_DEPARTMENT_OTHER): Payer: BC Managed Care – PPO

## 2023-07-20 ENCOUNTER — Emergency Department (HOSPITAL_BASED_OUTPATIENT_CLINIC_OR_DEPARTMENT_OTHER)
Admission: EM | Admit: 2023-07-20 | Discharge: 2023-07-20 | Disposition: A | Discharge status: Home / Self Care | Payer: BC Managed Care – PPO | Attending: Emergency Medicine | Admitting: Emergency Medicine

## 2023-07-20 DIAGNOSIS — S62617A Displaced fracture of proximal phalanx of left little finger, initial encounter for closed fracture: Secondary | ICD-10-CM | POA: Diagnosis not present

## 2023-07-20 DIAGNOSIS — S62629A Displaced fracture of medial phalanx of unspecified finger, initial encounter for closed fracture: Secondary | ICD-10-CM | POA: Diagnosis not present

## 2023-07-20 DIAGNOSIS — M79644 Pain in right finger(s): Secondary | ICD-10-CM | POA: Insufficient documentation

## 2023-07-20 DIAGNOSIS — S60947A Unspecified superficial injury of left little finger, initial encounter: Secondary | ICD-10-CM | POA: Diagnosis not present

## 2023-07-20 DIAGNOSIS — S62647A Nondisplaced fracture of proximal phalanx of left little finger, initial encounter for closed fracture: Secondary | ICD-10-CM | POA: Diagnosis not present

## 2023-07-20 DIAGNOSIS — S62622A Displaced fracture of medial phalanx of right middle finger, initial encounter for closed fracture: Secondary | ICD-10-CM | POA: Diagnosis not present

## 2023-07-20 DIAGNOSIS — S62616A Displaced fracture of proximal phalanx of right little finger, initial encounter for closed fracture: Secondary | ICD-10-CM | POA: Diagnosis not present

## 2023-07-20 DIAGNOSIS — W228XXA Striking against or struck by other objects, initial encounter: Secondary | ICD-10-CM | POA: Diagnosis not present

## 2023-07-20 DIAGNOSIS — S62619A Displaced fracture of proximal phalanx of unspecified finger, initial encounter for closed fracture: Secondary | ICD-10-CM

## 2023-07-20 NOTE — ED Triage Notes (Addendum)
Pt presents with a L hand injury after punching a wall. Pt also reports injury to the middle finger on his R hand as well.

## 2023-07-20 NOTE — ED Provider Notes (Signed)
[place] EMERGENCY DEPARTMENT AT [facility] Provider Note   CSN: [id number] Arrival date & time: 07/20/23  1154     History  Chief Complaint  Patient presents with   Hand Injury    Benjamin Lam is a 22 y.o. male.  Patient with no pertinent past medical history presents today with complaints of hand injury. He states that same occurred immediately prior to arrival when he punched a wall with both of his hands. He notes pain to his right and left hand. Pain on the left hand is at the base of the left 5th finger. Pain on the right hand is in the middle finger in the middle joint. Denies any other injuries or complaints.  The history is provided by the patient. No language interpreter was used.  Hand Injury      Home Medications Prior to Admission medications   Medication Sig Start Date End Date Taking? Authorizing Provider  Calcium-Vitamins C & D (CALCIUM/C/D PO) Take by mouth. Patient not taking: Reported on 04/24/2022    [provider]  ibuprofen (ADVIL,MOTRIN) 600 MG tablet Take 1 tablet (600 mg total) by mouth every 8 (eight) hours as needed for moderate pain. Patient not taking: Reported on 04/24/2022 06/05/17   Albina Billet III, PA-C  polyethylene glycol (MIRALAX) 17 g packet Take 17 g by mouth daily as needed for severe constipation. Patient not taking: Reported on 04/24/2022 10/03/19   Wallis Bamberg, PA-C      Allergies    Penicillins    Review of Systems   Review of Systems  Musculoskeletal:  Positive for arthralgias and myalgias.  All other systems reviewed and are negative.   Physical Exam Updated Vital Signs BP 123/68 (BP Location: Right Arm)   Pulse 79   Temp 98.6 F (37 C)   Resp 18   Ht 6\' 3"  (1.905 m)   Wt 99.8 kg   SpO2 100%   BMI 27.50 kg/m  Physical Exam Vitals and nursing note reviewed.  Constitutional:      General: He is not in acute distress.    Appearance: Normal appearance. He is normal weight. He is  not ill-appearing, toxic-appearing or diaphoretic.  HENT:     Head: Normocephalic and atraumatic.  Cardiovascular:     Rate and Rhythm: Normal rate.  Pulmonary:     Effort: Pulmonary effort is normal. No respiratory distress.  Musculoskeletal:        General: Normal range of motion.     Cervical back: Normal range of motion.     Comments: TTP to the base of the left 5th finger. ROM intact with some pain.  Distal sensation intact and good capillary refill.  TTP to the PIP of the right middle finger.  ROM intact with minimal discomfort.  Distal sensation intact and good capillary refill.  No other areas of focal bony tenderness.  Skin:    General: Skin is warm and dry.  Neurological:     General: No focal deficit present.     Mental Status: He is alert.  Psychiatric:        Mood and Affect: Mood normal.        Behavior: Behavior normal.     ED Results / Procedures / Treatments   Labs (all labs ordered are listed, but only abnormal results are displayed) Labs Reviewed - No data to display  EKG None  Radiology DG Hand Complete Left  Result Date: 07/20/2023 CLINICAL DATA:  Left hand pain after  injury.  Punched a wall. EXAM: LEFT HAND - COMPLETE 3+ VIEW COMPARISON:  None Available. FINDINGS: Subtle nondisplaced fracture involving the fifth digit proximal phalanx. No intra-articular involvement. No additional fracture. The alignment joint spaces are preserved. No erosive change. IMPRESSION: Subtle nondisplaced fracture of the fifth digit proximal phalanx. Electronically Signed   By: Narda Rutherford M.D.   On: 07/20/2023 14:03   DG Finger Middle Right  Result Date: 07/20/2023 CLINICAL DATA:  Middle finger pain. EXAM: RIGHT MIDDLE FINGER 2+V COMPARISON:  Radiograph 07/22/2016 FINDINGS: No acute fracture or dislocation. Irregularity of the volar plate of the middle phalanx is related to remote prior fracture. The alignment and joint spaces are normal. No erosive change. No soft tissue  abnormalities. IMPRESSION: 1. No acute fracture or dislocation. 2. Remote volar plate fracture of the middle phalanx. Electronically Signed   By: Narda Rutherford M.D.   On: 07/20/2023 14:02    Procedures Procedures    Medications Ordered in ED Medications - No data to display  ED Course/ Medical Decision Making/ A&P                                 Medical Decision Making Amount and/or Complexity of Data Reviewed Radiology: ordered.   .This patient is a 22 y.o. male  who presents to the ED for concern of left hand and right middle finger injury.   Differential diagnoses prior to evaluation: The emergent differential diagnosis includes, but is not limited to,  trauma . This is not an exhaustive differential.   Past Medical History / Co-morbidities / Social History:  has a past medical history of Closed Salter-Harris type III physeal fracture of proximal end of tibia (12/14/2016).  Additional history: Chart reviewed.  Physical Exam: Physical exam performed. The pertinent findings include: per above, TTP right MCP, TTP left pip right middle finger  Lab Tests/Imaging studies: I personally interpreted labs/imaging and the pertinent results include:    Left hand: Subtle nondisplaced fracture of the fifth digit proximal phalanx.   Right middle finger: 1. No acute fracture or dislocation. 2. Remote volar plate fracture of the middle phalanx.  I agree with the radiologist interpretation.   Medications: I offered pain medication which patient declined.  Disposition: After consideration of the diagnostic results and the patients response to treatment, I feel that emergency department workup does not suggest an emergent condition requiring admission or immediate intervention beyond what has been performed at this time. The plan is: Discharge with finger splint for the left fifth phalanx fracture.  Will also give referral to hand specialist for follow-up.  Supportive measures with  RICE and Tylenol/ibuprofen recommended and discussed.  Evaluation and diagnostic testing in the emergency department does not suggest an emergent condition requiring admission or immediate intervention beyond what has been performed at this time.  Plan for discharge with close PCP follow-up.  Patient is understanding and amenable with plan, educated on red flag symptoms that would prompt immediate return.  Patient discharged in stable condition.  Final Clinical Impression(s) / ED Diagnoses Final diagnoses:  Closed displaced fracture of proximal phalanx of finger of left hand    Rx / DC Orders ED Discharge Orders     None     An After Visit Summary was printed and given to the patient.     Silva Bandy, PA-C 07/20/23 1534    Derwood Kaplan, MD 07/21/23 [phone]

## 2023-07-20 NOTE — Discharge Instructions (Signed)
As we discussed, the base of your left pinky finger is broken.  We have placed you in a splint for this and you need to wear it at all times.  You may remove it when you take a shower but need to wear it when sleeping.  I have also given you a referral to a hand specialist with a number to call to schedule an appointment for follow-up.  Please call at your earliest convenience.  I recommend that you rest, ice, compress, and elevate your hand and take Tylenol/ibuprofen as needed for pain.  Return if development of any new or worsening symptoms.

## 2023-07-31 DIAGNOSIS — F4323 Adjustment disorder with mixed anxiety and depressed mood: Secondary | ICD-10-CM | POA: Diagnosis not present

## 2023-11-25 ENCOUNTER — Other Ambulatory Visit: Payer: Self-pay

## 2023-11-25 ENCOUNTER — Emergency Department (HOSPITAL_BASED_OUTPATIENT_CLINIC_OR_DEPARTMENT_OTHER)
Admission: EM | Admit: 2023-11-25 | Discharge: 2023-11-26 | Disposition: A | Discharge status: Home / Self Care | Payer: Self-pay | Attending: Emergency Medicine | Admitting: Emergency Medicine

## 2023-11-25 ENCOUNTER — Emergency Department (HOSPITAL_BASED_OUTPATIENT_CLINIC_OR_DEPARTMENT_OTHER): Payer: Self-pay

## 2023-11-25 ENCOUNTER — Encounter (HOSPITAL_BASED_OUTPATIENT_CLINIC_OR_DEPARTMENT_OTHER): Payer: Self-pay | Admitting: Emergency Medicine

## 2023-11-25 DIAGNOSIS — W228XXA Striking against or struck by other objects, initial encounter: Secondary | ICD-10-CM | POA: Insufficient documentation

## 2023-11-25 DIAGNOSIS — S61511A Laceration without foreign body of right wrist, initial encounter: Secondary | ICD-10-CM | POA: Insufficient documentation

## 2023-11-25 MED ORDER — LIDOCAINE-EPINEPHRINE (PF) 2 %-1:200000 IJ SOLN
10.0000 mL | Freq: Once | INTRAMUSCULAR | Status: AC
  Administered 2023-11-25: 10 mL
  Filled 2023-11-25: qty 20

## 2023-11-25 MED ORDER — FENTANYL CITRATE PF 50 MCG/ML IJ SOSY
50.0000 ug | PREFILLED_SYRINGE | Freq: Once | INTRAMUSCULAR | Status: AC
  Administered 2023-11-25: 50 ug via INTRAVENOUS
  Filled 2023-11-25: qty 1

## 2023-11-25 NOTE — ED Triage Notes (Signed)
Pt to ED from home c/o right wrist laceration after punching a mirror tonight.  Large right medial anterior laceration with active bleeding.  Pt able to move all digits on right hand but states fingers are cool.  Pressure bandage applied in triage.

## 2023-11-25 NOTE — Discharge Instructions (Signed)
You were evaluated in the emergency room for a laceration.  This was sutured closed.  X-ray did not show any retained foreign body.  Please return to the emergency room in 10 days for suture removal.  If you experience any new or worsening symptoms including extreme worsening pain and persistent bleeding, dizziness please return to the emergency room.

## 2023-11-25 NOTE — ED Provider Notes (Signed)
[place] EMERGENCY DEPARTMENT AT [facility] Provider Note   CSN: [id number] Arrival date & time: 11/25/23  2151     History  Chief Complaint  Patient presents with   Extremity Laceration    Benjamin Lam is a 23 y.o. male who presents with laceration to the right wrist after hitting a mirror.  Patient reports he is up today on his tetanus.  Describes pain with movement and persistent bleeding.  HPI     Home Medications Prior to Admission medications   Medication Sig Start Date End Date Taking? Authorizing Provider  Calcium-Vitamins C & D (CALCIUM/C/D PO) Take by mouth. Patient not taking: Reported on 04/24/2022    [provider]  ibuprofen (ADVIL,MOTRIN) 600 MG tablet Take 1 tablet (600 mg total) by mouth every 8 (eight) hours as needed for moderate pain. Patient not taking: Reported on 04/24/2022 06/05/17   Albina Billet III, PA-C  polyethylene glycol (MIRALAX) 17 g packet Take 17 g by mouth daily as needed for severe constipation. Patient not taking: Reported on 04/24/2022 10/03/19   Wallis Bamberg, PA-C      Allergies    Penicillins    Review of Systems   Review of Systems  Physical Exam Updated Vital Signs BP 124/80 (BP Location: Left Arm)   Pulse 71   Temp 98 F (36.7 C) (Oral)   Resp 20   Ht 6\' 3"  (1.905 m)   Wt 99 kg   SpO2 100%   BMI 27.28 kg/m  Physical Exam Vitals and nursing note reviewed.  Constitutional:      General: He is not in acute distress.    Appearance: He is well-developed.  HENT:     Head: Normocephalic and atraumatic.  Eyes:     Conjunctiva/sclera: Conjunctivae normal.  Cardiovascular:     Rate and Rhythm: Normal rate.     Pulses: Normal pulses.  Pulmonary:     Effort: Pulmonary effort is normal. No respiratory distress.  Musculoskeletal:     Cervical back: Neck supple.     Comments: 4 cm laceration on the ulnar aspect of the right wrist.  Hemostasis is yet to be achieved, there is no pulsatile  flow, he is capable of making a full fist, and mobilizing his wrist, he is NVI, radial and ulnar pulses are intact.  Skin:    General: Skin is warm and dry.     Capillary Refill: Capillary refill takes less than 2 seconds.  Neurological:     Mental Status: He is alert.  Psychiatric:        Mood and Affect: Mood normal.     ED Results / Procedures / Treatments   Labs (all labs ordered are listed, but only abnormal results are displayed) Labs Reviewed - No data to display  EKG None  Radiology DG Wrist Complete Right Result Date: 11/25/2023 CLINICAL DATA:  Buyer, retail.  Laceration. EXAM: RIGHT WRIST - COMPLETE 3+ VIEW COMPARISON:  None Available. FINDINGS: Laceration in the anterior wrist. No radiopaque foreign body. No acute bony abnormality. Specifically, no fracture, subluxation, or dislocation. IMPRESSION: No fracture or foreign body. Electronically Signed   By: Charlett Nose M.D.   On: 11/25/2023 22:36    Procedures .Laceration Repair  Date/Time: 11/26/2023 12:56 AM  Performed by: Halford Decamp, PA-C Authorized by: Halford Decamp, PA-C   Consent:    Consent obtained:  Verbal   Consent given by:  Patient   Risks, benefits, and alternatives were discussed: yes  Risks discussed:  Infection and pain   Alternatives discussed:  No treatment Universal protocol:    Procedure explained and questions answered to patient or proxy's satisfaction: yes     Patient identity confirmed:  Verbally with patient and arm band Anesthesia:    Anesthesia method:  Local infiltration   Local anesthetic:  Lidocaine 2% WITH epi Laceration details:    Location:  Hand   Hand location:  R wrist   Length (cm):  5 Exploration:    Hemostasis achieved with:  Epinephrine   Imaging obtained: x-ray   Treatment:    Area cleansed with:  Povidone-iodine   Irrigation solution:  Sterile saline Skin repair:    Repair method:  Sutures   Suture size:  5-0   Suture material:  Prolene    Suture technique:  Simple interrupted   Number of sutures:  16 Repair type:    Repair type:  Simple Post-procedure details:    Procedure completion:  Tolerated     Medications Ordered in ED Medications  fentaNYL (SUBLIMAZE) injection 50 mcg (50 mcg Intravenous Given 11/25/23 2350)  lidocaine-EPINEPHrine (XYLOCAINE W/EPI) 2 %-1:200000 (PF) injection 10 mL (10 mLs Infiltration Given 11/25/23 2356)    ED Course/ Medical Decision Making/ A&P                                 Medical Decision Making Amount and/or Complexity of Data Reviewed Radiology: ordered.  Risk Prescription drug management.   This patient presents to the ED with chief complaint(s) of wrist laceration.  The complaint involves an extensive differential diagnosis and also carries with it a high risk of complications and morbidity.   pertinent past medical history as listed in HPI  The differential diagnosis includes  Simple laceration, retained foreign body, tendon versus vascular versus nerve involvement The initial plan is to  Obtain plain films of wrist Additional history obtained: Additional history obtained from family  Initial Assessment:   Patient presents with laceration to his left wrist from hitting a mirror.  Pulses are brisk.  Hemostasis is not yet achieved, there is no pulsatile flow.  He is NVI capable of making a full fist moving digits.  Capable of moving wrist.  Overall do do not suspect vascular, tendon or nerve involvement.  No foreign body visualized on x-ray.   Independent ECG interpretation:  none  Independent labs interpretation:  The following labs were independently interpreted:  none  Independent visualization and interpretation of imaging: I independently visualized the following imaging with scope of interpretation limited to determining acute life threatening conditions related to emergency care: R wrist, which revealed no retained foreign body visualized  Treatment and  Reassessment: Patient given 50 of fentanyl, laceration sutured closed  Consultations obtained:   none  Disposition:   Patient will be discharged home.  He will return in 10 days for suture removal. The patient has been appropriately medically screened and/or stabilized in the ED. I have low suspicion for any other emergent medical condition which would require further screening, evaluation or treatment in the ED or require inpatient management. At time of discharge the patient is hemodynamically stable and in no acute distress. I have discussed work-up results and diagnosis with patient and answered all questions. Patient is agreeable with discharge plan. We discussed strict return precautions for returning to the emergency department and they verbalized understanding.     Social Determinants of Health:   none  This note was dictated with voice recognition software.  Despite best efforts at proofreading, errors may have occurred which can change the documentation meaning.          Final Clinical Impression(s) / ED Diagnoses Final diagnoses:  Laceration of right wrist, initial encounter    Rx / DC Orders ED Discharge Orders     None         Halford Decamp, PA-C 11/26/23 0057    Vanetta Mulders, MD 11/27/23 1343

## 2023-11-25 NOTE — ED Provider Notes (Incomplete)
[place] EMERGENCY DEPARTMENT AT [facility] Provider Note   CSN: [id number] Arrival date & time: 11/25/23  2151     History {Add pertinent medical, surgical, social history, OB history to HPI:1} Chief Complaint  Patient presents with  . Extremity Laceration    Benjamin Lam is a 23 y.o. male who presents with laceration to the right wrist after hitting a mirror.  Patient reports he is up today on his tetanus.  Describes pain with movement and persistent bleeding.  HPI     Home Medications Prior to Admission medications   Medication Sig Start Date End Date Taking? Authorizing Provider  Calcium-Vitamins C & D (CALCIUM/C/D PO) Take by mouth. Patient not taking: Reported on 04/24/2022    [provider]  ibuprofen (ADVIL,MOTRIN) 600 MG tablet Take 1 tablet (600 mg total) by mouth every 8 (eight) hours as needed for moderate pain. Patient not taking: Reported on 04/24/2022 06/05/17   Albina Billet III, PA-C  polyethylene glycol (MIRALAX) 17 g packet Take 17 g by mouth daily as needed for severe constipation. Patient not taking: Reported on 04/24/2022 10/03/19   Wallis Bamberg, PA-C      Allergies    Penicillins    Review of Systems   Review of Systems  Physical Exam Updated Vital Signs BP 124/80 (BP Location: Left Arm)   Pulse 71   Temp 98 F (36.7 C) (Oral)   Resp 20   Ht 6\' 3"  (1.905 m)   Wt 99 kg   SpO2 100%   BMI 27.28 kg/m  Physical Exam Vitals and nursing note reviewed.  Constitutional:      General: He is not in acute distress.    Appearance: He is well-developed.  HENT:     Head: Normocephalic and atraumatic.  Eyes:     Conjunctiva/sclera: Conjunctivae normal.  Cardiovascular:     Rate and Rhythm: Normal rate.     Pulses: Normal pulses.  Pulmonary:     Effort: Pulmonary effort is normal. No respiratory distress.  Musculoskeletal:     Cervical back: Neck supple.     Comments: 4 cm laceration on the ulnar aspect of the  right wrist.  Hemostasis is yet to be achieved, there is no pulsatile flow, he is capable of making a full fist, and mobilizing his wrist, he is NVI, radial and ulnar pulses are intact.  Skin:    General: Skin is warm and dry.     Capillary Refill: Capillary refill takes less than 2 seconds.  Neurological:     Mental Status: He is alert.  Psychiatric:        Mood and Affect: Mood normal.     ED Results / Procedures / Treatments   Labs (all labs ordered are listed, but only abnormal results are displayed) Labs Reviewed - No data to display  EKG None  Radiology No results found.  Procedures Procedures  {Document cardiac monitor, telemetry assessment procedure when appropriate:1}  Medications Ordered in ED Medications  fentaNYL (SUBLIMAZE) injection 50 mcg (has no administration in time range)  lidocaine-EPINEPHrine (XYLOCAINE W/EPI) 2 %-1:200000 (PF) injection 10 mL (has no administration in time range)    ED Course/ Medical Decision Making/ A&P   {   Click here for ABCD2, HEART and other calculatorsREFRESH Note before signing :1}                              Medical Decision Making Amount  and/or Complexity of Data Reviewed Radiology: ordered.  Risk Prescription drug management.   This patient presents to the ED with chief complaint(s) of wrist laceration.  The complaint involves an extensive differential diagnosis and also carries with it a high risk of complications and morbidity.   pertinent past medical history as listed in HPI  The differential diagnosis includes  Simple laceration, retained foreign body, tendon versus vascular versus nerve involvement The initial plan is to  Obtain plain films of wrist Additional history obtained: Additional history obtained from family  Initial Assessment:   Patient presents with laceration to his left wrist from hitting a mirror.  Pulses are brisk.  Hemostasis is not yet achieved, there is no pulsatile flow.  He is NVI  capable of making a full fist moving digits.  Capable of moving wrist.  Overall do do not suspect vascular, tendon or nerve involvement.  No foreign body visualized on x-ray.   Independent ECG interpretation:  none  Independent labs interpretation:  The following labs were independently interpreted:  none  Independent visualization and interpretation of imaging: I independently visualized the following imaging with scope of interpretation limited to determining acute life threatening conditions related to emergency care: R wrist, which revealed no retained foreign body visualized  Treatment and Reassessment: Patient given 50 of fentanyl, laceration sutured closed  Consultations obtained:   none  Disposition:   Patient will be discharged home.  He will return in 10 days for suture removal. The patient has been appropriately medically screened and/or stabilized in the ED. I have low suspicion for any other emergent medical condition which would require further screening, evaluation or treatment in the ED or require inpatient management. At time of discharge the patient is hemodynamically stable and in no acute distress. I have discussed work-up results and diagnosis with patient and answered all questions. Patient is agreeable with discharge plan. We discussed strict return precautions for returning to the emergency department and they verbalized understanding.     Social Determinants of Health:   none  This note was dictated with voice recognition software.  Despite best efforts at proofreading, errors may have occurred which can change the documentation meaning.    {Document critical care time when appropriate:1} {Document review of labs and clinical decision tools ie heart score, Chads2Vasc2 etc:1}  {Document your independent review of radiology images, and any outside records:1} {Document your discussion with family members, caretakers, and with consultants:1} {Document social  determinants of health affecting pt's care:1} {Document your decision making why or why not admission, treatments were needed:1} Final Clinical Impression(s) / ED Diagnoses Final diagnoses:  Laceration of right wrist, initial encounter    Rx / DC Orders ED Discharge Orders     None

## 2023-11-26 NOTE — ED Notes (Signed)
RN reviewed discharge instructions with pt. Pt verbalized understanding and had no further questions. VSS upon discharge.

## 2023-12-06 ENCOUNTER — Encounter (HOSPITAL_BASED_OUTPATIENT_CLINIC_OR_DEPARTMENT_OTHER): Payer: Self-pay | Admitting: Emergency Medicine

## 2023-12-06 ENCOUNTER — Emergency Department (HOSPITAL_BASED_OUTPATIENT_CLINIC_OR_DEPARTMENT_OTHER)
Admission: EM | Admit: 2023-12-06 | Discharge: 2023-12-06 | Disposition: A | Discharge status: Home / Self Care | Payer: BLUE CROSS/BLUE SHIELD | Attending: Emergency Medicine | Admitting: Emergency Medicine

## 2023-12-06 DIAGNOSIS — S61511D Laceration without foreign body of right wrist, subsequent encounter: Secondary | ICD-10-CM | POA: Diagnosis not present

## 2023-12-06 DIAGNOSIS — Z4802 Encounter for removal of sutures: Secondary | ICD-10-CM | POA: Insufficient documentation

## 2023-12-06 NOTE — ED Triage Notes (Signed)
 Stitch removed  Right wrist  Stitched placed 11/25/23

## 2023-12-06 NOTE — ED Provider Notes (Signed)
 [place] EMERGENCY DEPARTMENT AT [facility] Provider Note   CSN: [id number] Arrival date & time: 12/06/23  1649     History  Chief Complaint  Patient presents with   Suture / Staple Removal    Benjamin Lam is a 22 y.o. male with overall noncontributory past medical history who presents with concern for stitches in place on right wrist after he punched a mirror and had a large laceration which was repaired around 11 days ago.  Patient reports wound has been healing appropriately, he has been keeping it covered.  He is not concerned about any signs of infection.   Suture / Staple Removal       Home Medications Prior to Admission medications   Medication Sig Start Date End Date Taking? Authorizing Provider  Calcium-Vitamins C & D (CALCIUM/C/D PO) Take by mouth. Patient not taking: Reported on 04/24/2022    [provider]  ibuprofen (ADVIL,MOTRIN) 600 MG tablet Take 1 tablet (600 mg total) by mouth every 8 (eight) hours as needed for moderate pain. Patient not taking: Reported on 04/24/2022 06/05/17   Albina Billet III, PA-C  polyethylene glycol (MIRALAX) 17 g packet Take 17 g by mouth daily as needed for severe constipation. Patient not taking: Reported on 04/24/2022 10/03/19   Wallis Bamberg, PA-C      Allergies    Penicillins    Review of Systems   Review of Systems  All other systems reviewed and are negative.   Physical Exam Updated Vital Signs BP 108/71   Pulse 76   Temp 98.7 F (37.1 C)   Resp 14   SpO2 100%  Physical Exam Vitals and nursing note reviewed.  Constitutional:      General: He is not in acute distress.    Appearance: Normal appearance.  HENT:     Head: Normocephalic and atraumatic.  Eyes:     General:        Right eye: No discharge.        Left eye: No discharge.  Cardiovascular:     Rate and Rhythm: Normal rate and regular rhythm.  Pulmonary:     Effort: Pulmonary effort is normal. No respiratory distress.   Musculoskeletal:        General: No deformity.  Skin:    General: Skin is warm and dry.     Comments: Appropriately healing laceration on the right wrist with no signs of infection.  Neurological:     Mental Status: He is alert and oriented to person, place, and time.  Psychiatric:        Mood and Affect: Mood normal.        Behavior: Behavior normal.     ED Results / Procedures / Treatments   Labs (all labs ordered are listed, but only abnormal results are displayed) Labs Reviewed - No data to display  EKG None  Radiology No results found.  Procedures Suture Removal  Date/Time: 12/06/2023 5:24 PM  Performed by: Olene Floss, PA-C Authorized by: Olene Floss, PA-C   Consent:    Consent obtained:  Verbal   Consent given by:  Patient   Risks, benefits, and alternatives were discussed: yes     Risks discussed:  Wound separation, bleeding and pain   Alternatives discussed:  No treatment Universal protocol:    Procedure explained and questions answered to patient or proxy's satisfaction: yes     Patient identity confirmed:  Verbally with patient Location:    Location:  Upper extremity  Upper extremity location:  Wrist   Wrist location:  R wrist Procedure details:    Wound appearance:  No signs of infection and good wound healing   Number of sutures removed:  16 Post-procedure details:    Post-removal:  Dressing applied   Procedure completion:  Tolerated     Medications Ordered in ED Medications - No data to display  ED Course/ Medical Decision Making/ A&P                                 Medical Decision Making  Patient presents for suture removal of right wrist, wound is appropriately healing at time of suture removal.  Sutures removed without difficulty.  Provided wound care instructions. Final Clinical Impression(s) / ED Diagnoses Final diagnoses:  Visit for suture removal    Rx / DC Orders ED Discharge Orders     None          Olene Floss, PA-C 12/06/23 1724    Royanne Foots, DO 12/07/23 1225

## 2023-12-06 NOTE — Discharge Instructions (Signed)
 Continue keeping the wound clean, covered, change the bandage at least 1 daily, you can apply thin layer Polysporin, Neosporin, or simply Vaseline, Aquaphor directly over the wound to help with keeping it moisturized while it continues to heal.  As the wound continues to heal I would make sure that you are wearing sunscreen to prevent scar darkening, at some point in the future if you do not like the way the scar looks after around 6 months to a year you can reach out to a plastic surgeon to talk about scar revision.

## 2023-12-06 NOTE — ED Notes (Signed)
 Reviewed AVS/discharge instruction with patient. Time allotted for and all questions answered. Patient is agreeable for d/c and escorted to ed exit by staff.

## 2023-12-31 DIAGNOSIS — M79605 Pain in left leg: Secondary | ICD-10-CM | POA: Diagnosis not present

## 2024-01-01 DIAGNOSIS — Z202 Contact with and (suspected) exposure to infections with a predominantly sexual mode of transmission: Secondary | ICD-10-CM | POA: Diagnosis not present

## 2024-01-08 DIAGNOSIS — M545 Low back pain, unspecified: Secondary | ICD-10-CM | POA: Diagnosis not present

## 2024-01-12 DIAGNOSIS — N509 Disorder of male genital organs, unspecified: Secondary | ICD-10-CM | POA: Diagnosis not present

## 2024-01-14 ENCOUNTER — Other Ambulatory Visit (HOSPITAL_BASED_OUTPATIENT_CLINIC_OR_DEPARTMENT_OTHER): Payer: Self-pay | Admitting: Physician Assistant

## 2024-01-14 DIAGNOSIS — N5089 Other specified disorders of the male genital organs: Secondary | ICD-10-CM

## 2024-01-15 ENCOUNTER — Emergency Department (HOSPITAL_BASED_OUTPATIENT_CLINIC_OR_DEPARTMENT_OTHER)
Admission: EM | Admit: 2024-01-15 | Discharge: 2024-01-15 | Disposition: A | Discharge status: Home / Self Care | Attending: Emergency Medicine | Admitting: Emergency Medicine

## 2024-01-15 ENCOUNTER — Encounter (HOSPITAL_BASED_OUTPATIENT_CLINIC_OR_DEPARTMENT_OTHER): Payer: Self-pay | Admitting: Emergency Medicine

## 2024-01-15 ENCOUNTER — Emergency Department (HOSPITAL_BASED_OUTPATIENT_CLINIC_OR_DEPARTMENT_OTHER)

## 2024-01-15 ENCOUNTER — Other Ambulatory Visit: Payer: Self-pay

## 2024-01-15 DIAGNOSIS — N451 Epididymitis: Secondary | ICD-10-CM | POA: Insufficient documentation

## 2024-01-15 DIAGNOSIS — N503 Cyst of epididymis: Secondary | ICD-10-CM | POA: Diagnosis not present

## 2024-01-15 DIAGNOSIS — N50811 Right testicular pain: Secondary | ICD-10-CM | POA: Diagnosis not present

## 2024-01-15 MED ORDER — CEFTRIAXONE SODIUM 500 MG IJ SOLR: 500.0000 mg | Freq: Once | INTRAMUSCULAR | Status: DC

## 2024-01-15 MED ORDER — CEFTRIAXONE SODIUM 1 G IJ SOLR
1.0000 g | Freq: Once | INTRAMUSCULAR | Status: AC
  Administered 2024-01-15: 1 g via INTRAMUSCULAR
  Filled 2024-01-15: qty 10

## 2024-01-15 MED ORDER — LIDOCAINE HCL (PF) 1 % IJ SOLN
INTRAMUSCULAR | Status: AC
  Filled 2024-01-15: qty 5

## 2024-01-15 MED ORDER — DOXYCYCLINE HYCLATE 100 MG PO CAPS: 100.0000 mg | ORAL_CAPSULE | Freq: Two times a day (BID) | ORAL | 0 refills | Status: AC

## 2024-01-15 MED ORDER — SODIUM CHLORIDE 0.9 % IV SOLN: 1.0000 g | Freq: Once | INTRAVENOUS | Status: DC

## 2024-01-15 NOTE — ED Triage Notes (Signed)
 C/o R testicular pain x 1 week. Swollen and painful to touch. States pain is in R hip and R lower back as well. Notes a small bump on R testicle last week.

## 2024-01-15 NOTE — Discharge Instructions (Addendum)
-   We have tested you for STDs, these will populate in your MyChart - I recommend follow-up with your PCP - We will treat you for epididymitis.  You received a intramuscular injection today of ceftriaxone.  Additionally will take doxycycline for 10 days. - Reassuringly, epididymal cyst is not a concerning finding.  If it remains problematic for you, your PCP can refer you to urology.

## 2024-01-15 NOTE — ED Provider Notes (Signed)
 [place] EMERGENCY DEPARTMENT AT [facility] Provider Note   CSN: [id number] Arrival date & time: 01/15/24  1204     History  Chief Complaint  Patient presents with   Testicle Pain    Benjamin Lam is a 23 y.o. male.  23 year old male with right-sided intermittent testicular pain and mass.   Testicle Pain Pertinent negatives include no chest pain and no abdominal pain.   Patient reports early March she had a rash on his penis, went to [facility] had [facility] testing (did not know the results of these tests) and was empirically treated with doxycycline for 7 days for chlamydia and received 1 dose of ceftriaxone for gonorrhea. He returned to walk-in clinic with Deboraha Sprang (PCP), with concern of right-sided testicular pain and mass for approximately 1 month.  They had ordered an ultrasound outpatient, however patient was concerned and came to ED for ultrasound to be obtained quicker. Denies dysuria, dyspareunia, penile discharge.  Also reports some inguinal pain.  No rash currently.     Home Medications Prior to Admission medications   Medication Sig Start Date End Date Taking? Authorizing Provider  doxycycline (VIBRAMYCIN) 100 MG capsule Take 1 capsule (100 mg total) by mouth 2 (two) times daily for 10 days. 01/15/24 01/25/24 Yes Tiffany Kocher, DO  meloxicam (MOBIC) 15 MG tablet Take 15 mg by mouth daily. 12/31/23 01/28/24 Yes [provider]  ibuprofen (ADVIL,MOTRIN) 600 MG tablet Take 1 tablet (600 mg total) by mouth every 8 (eight) hours as needed for moderate pain. Patient not taking: Reported on 04/24/2022 06/05/17   Albina Billet III, PA-C  polyethylene glycol (MIRALAX) 17 g packet Take 17 g by mouth daily as needed for severe constipation. Patient not taking: Reported on 04/24/2022 10/03/19   Wallis Bamberg, PA-C      Allergies    Penicillins    Review of Systems   Review of Systems  Constitutional:  Negative for chills and fever.  Cardiovascular:   Negative for chest pain.  Gastrointestinal:  Negative for abdominal pain.  Genitourinary:  Positive for testicular pain. Negative for flank pain.    Physical Exam Updated Vital Signs BP (!) 147/88 (BP Location: Left Arm)   Pulse 78   Temp 98.7 F (37.1 C) (Oral)   Resp 18   SpO2 99%  Physical Exam Cardiovascular:     Rate and Rhythm: Normal rate and regular rhythm.     Pulses: Normal pulses.     Heart sounds: Normal heart sounds.  Pulmonary:     Effort: Pulmonary effort is normal.     Breath sounds: Normal breath sounds.  Abdominal:     General: Abdomen is flat. There is no distension.     Palpations: Abdomen is soft.     Tenderness: There is no abdominal tenderness. There is no guarding.  Genitourinary:    Comments: Chaperone Stephanie NT present during GU exam Normal penis.  Testes are normal, small mass located superior right testicle, not tender.  No evidence of inguinal hernia on exam.    ED Results / Procedures / Treatments   Labs (all labs ordered are listed, but only abnormal results are displayed) Labs Reviewed  HIV ANTIBODY (ROUTINE TESTING W REFLEX)  RPR  URINALYSIS, ROUTINE W REFLEX MICROSCOPIC  GC/CHLAMYDIA PROBE AMP ([place]) NOT AT [facility]    EKG None  Radiology US SCROTUM W/DOPPLER Result Date: 01/15/2024 CLINICAL DATA:  Right testicular pain for 2-3 weeks EXAM: SCROTAL ULTRASOUND DOPPLER ULTRASOUND OF THE TESTICLES TECHNIQUE: Complete  ultrasound examination of the testicles, epididymis, and other scrotal structures was performed. Color and spectral Doppler ultrasound were also utilized to evaluate blood flow to the testicles. COMPARISON:  None available FINDINGS: Right testicle Measurements: 4.2 x 2.2 x 2.6 cm. No mass or microlithiasis visualized. Left testicle Measurements: 3.5 x 2.0 x 2.5 cm. No mass or microlithiasis visualized. Right epididymis:  Normal in size.  6 mm epididymal cyst is seen. Left epididymis:  Normal in size and appearance.  Hydrocele:  None visualized. Varicocele:  None visualized. Pulsed Doppler interrogation of both testes demonstrates normal low resistance arterial and venous waveforms bilaterally. IMPRESSION: No significant sonographic abnormality of the testes. No evidence of testicular torsion. Electronically Signed   By: Acquanetta Belling M.D.   On: 01/15/2024 13:28    Procedures Procedures    Medications Ordered in ED Medications  cefTRIAXone (ROCEPHIN) injection 500 mg (has no administration in time range)    ED Course/ Medical Decision Making/ A&P                                 Medical Decision Making 23 year old male with intermittent right-sided testicular pain and palpable mass (present for 1 month).  Differential includes: Epididymal cyst, epididymitis, varicocele, hernia malignancy.  Obtain testicular ultrasound.  1500 Independently reviewed testicular ultrasound: Cystic structure, consistent with epididymal cyst. Physical exam consistent with epididymal cyst.  Provided reassurance to patient.  Offered STI testing in case of epididymitis given pain.  Additionally will treat prophylactically with 500 mg ceftriaxone IM, and 100 mg doxycycline twice daily for 10-day course.  Patient expressed understanding and agreement plan.  Amount and/or Complexity of Data Reviewed Labs: ordered. Radiology: ordered.  Risk Prescription drug management.         Final Clinical Impression(s) / ED Diagnoses Final diagnoses:  Epididymal cyst  Epididymitis    Rx / DC Orders ED Discharge Orders          Ordered    doxycycline (VIBRAMYCIN) 100 MG capsule  2 times daily        01/15/24 1516              Tiffany Kocher, DO 01/15/24 1522    Linwood Dibbles, MD 01/16/24 [phone]

## 2024-01-16 DIAGNOSIS — M5116 Intervertebral disc disorders with radiculopathy, lumbar region: Secondary | ICD-10-CM | POA: Diagnosis not present

## 2024-01-16 LAB — RPR: RPR Ser Ql: NONREACTIVE

## 2024-01-16 LAB — HIV ANTIBODY (ROUTINE TESTING W REFLEX): HIV Screen 4th Generation wRfx: NONREACTIVE

## 2024-04-14 DIAGNOSIS — K5900 Constipation, unspecified: Secondary | ICD-10-CM | POA: Diagnosis not present

## 2024-04-14 DIAGNOSIS — K648 Other hemorrhoids: Secondary | ICD-10-CM | POA: Diagnosis not present

## 2024-05-13 DIAGNOSIS — U071 COVID-19: Secondary | ICD-10-CM | POA: Diagnosis not present

## 2024-05-13 DIAGNOSIS — R0602 Shortness of breath: Secondary | ICD-10-CM | POA: Diagnosis not present

## 2024-05-13 DIAGNOSIS — J069 Acute upper respiratory infection, unspecified: Secondary | ICD-10-CM | POA: Diagnosis not present

## 2024-05-13 DIAGNOSIS — R053 Chronic cough: Secondary | ICD-10-CM | POA: Diagnosis not present

## 2024-07-10 ENCOUNTER — Emergency Department (HOSPITAL_BASED_OUTPATIENT_CLINIC_OR_DEPARTMENT_OTHER)

## 2024-07-10 ENCOUNTER — Other Ambulatory Visit: Payer: Self-pay

## 2024-07-10 ENCOUNTER — Emergency Department (HOSPITAL_BASED_OUTPATIENT_CLINIC_OR_DEPARTMENT_OTHER)
Admission: EM | Admit: 2024-07-10 | Discharge: 2024-07-10 | Disposition: A | Discharge status: Home / Self Care | Attending: Emergency Medicine | Admitting: Emergency Medicine

## 2024-07-10 DIAGNOSIS — W231XXA Caught, crushed, jammed, or pinched between stationary objects, initial encounter: Secondary | ICD-10-CM | POA: Insufficient documentation

## 2024-07-10 DIAGNOSIS — S67191A Crushing injury of left index finger, initial encounter: Secondary | ICD-10-CM | POA: Diagnosis not present

## 2024-07-10 DIAGNOSIS — S6722XA Crushing injury of left hand, initial encounter: Secondary | ICD-10-CM | POA: Diagnosis not present

## 2024-07-10 NOTE — ED Triage Notes (Signed)
 Pt shut left pointer finger in car door yesterday. Sensation intact. Bruising around nail bed

## 2024-07-10 NOTE — ED Notes (Signed)
 Pt given discharge instructions. Opportunities given for questions. Pt verbalizes understanding. Bethena Powell SAUNDERS, RN

## 2024-07-10 NOTE — ED Provider Notes (Signed)
 [place] EMERGENCY DEPARTMENT AT [facility] Provider Note   CSN: [id number] Arrival date & time: 07/10/24  9368     Patient presents with: Finger Injury   Benjamin Lam is a 23 y.o. male.   Patient got left index finger crushed in a car door yesterday.  Patient has a lot of bruising to the proximal part of the nailbed.  Stiff to bend at the DIP joint.  No laceration.       Prior to Admission medications   Medication Sig Start Date End Date Taking? Authorizing Provider  ibuprofen  (ADVIL ,MOTRIN ) 600 MG tablet Take 1 tablet (600 mg total) by mouth every 8 (eight) hours as needed for moderate pain. Patient not taking: Reported on 04/24/2022 06/05/17   Delynn Victory Muller III, PA-C  polyethylene glycol (MIRALAX ) 17 g packet Take 17 g by mouth daily as needed for severe constipation. Patient not taking: Reported on 04/24/2022 10/03/19   Christopher Savannah, PA-C    Allergies: Penicillins    Review of Systems  Constitutional:  Negative for chills and fever.  HENT:  Negative for ear pain and sore throat.   Eyes:  Negative for pain and visual disturbance.  Respiratory:  Negative for cough and shortness of breath.   Cardiovascular:  Negative for chest pain and palpitations.  Gastrointestinal:  Negative for abdominal pain and vomiting.  Genitourinary:  Negative for dysuria and hematuria.  Musculoskeletal:  Positive for joint swelling. Negative for arthralgias and back pain.  Skin:  Negative for color change and rash.  Neurological:  Negative for seizures and syncope.  All other systems reviewed and are negative.   Updated Vital Signs BP (!) 129/98   Pulse 65   Temp 98.4 F (36.9 C)   Resp 18   Ht 1.905 m (6' 3)   Wt 99 kg   SpO2 100%   BMI 27.28 kg/m   Physical Exam Vitals and nursing note reviewed.  Constitutional:      General: He is not in acute distress.    Appearance: Normal appearance. He is well-developed.  HENT:     Head: Normocephalic and  atraumatic.     Mouth/Throat:     Mouth: Mucous membranes are moist.  Eyes:     Extraocular Movements: Extraocular movements intact.     Conjunctiva/sclera: Conjunctivae normal.     Pupils: Pupils are equal, round, and reactive to light.  Cardiovascular:     Rate and Rhythm: Normal rate and regular rhythm.     Heart sounds: No murmur heard. Pulmonary:     Effort: Pulmonary effort is normal. No respiratory distress.     Breath sounds: Normal breath sounds.  Abdominal:     Palpations: Abdomen is soft.     Tenderness: There is no abdominal tenderness.  Musculoskeletal:        General: Swelling, tenderness and signs of injury present.     Cervical back: Normal range of motion and neck supple.     Comments: Swelling and tenderness to the distal left index finger.  Some bruising over the nailbed that is almost black in color.  No abrasions no laceration sensation intact cap refill intact radial pulses 2+.  No proximal finger swelling.  Patient has reasonable movement at the DIP.  Excellent movement at PIP and at [facility].  Skin:    General: Skin is warm and dry.     Capillary Refill: Capillary refill takes less than 2 seconds.  Neurological:     General: No focal deficit present.  Mental Status: He is alert and oriented to person, place, and time.  Psychiatric:        Mood and Affect: Mood normal.     (all labs ordered are listed, but only abnormal results are displayed) Labs Reviewed - No data to display  EKG: None  Radiology: DG Finger Index Left Result Date: 07/10/2024 CLINICAL DATA:  23 year old male status post slammed finger in car door. EXAM: LEFT INDEX FINGER 2+V COMPARISON:  Left hand series 07/20/2023. FINDINGS: Three views. Bone mineralization is within normal limits. There is no evidence of fracture or dislocation. There is no evidence of arthropathy or other focal bone abnormality. Volar soft tissue swelling left 2nd finger. No soft tissue gas. No radiopaque foreign body  identified. IMPRESSION: Soft tissue swelling. No acute fracture or dislocation identified about the left 2nd finger. Electronically Signed   By: VEAR Hurst M.D.   On: 07/10/2024 07:12     Procedures   Medications Ordered in the ED - No data to display                                  Medical Decision Making Amount and/or Complexity of Data Reviewed Radiology: ordered.   Injury to the left index finger crush injury.  No open wounds.  X-ray shows no bony involvement or fractures.  Symptomatically.  Final diagnoses:  Crushing injury of left index finger, initial encounter    ED Discharge Orders     None          Geraldene Hamilton, MD 07/10/24 [phone]

## 2024-07-10 NOTE — Discharge Instructions (Signed)
 X-ray shows no evidence of any fracture.  Would work movement at the distal joint gently over the next couple weeks.  This will take at least 2 weeks and maybe a little bit longer.  Bruising to the nail will be present for several weeks.  Return for any new or worse symptoms.

## 2024-07-13 DIAGNOSIS — S60022A Contusion of left index finger without damage to nail, initial encounter: Secondary | ICD-10-CM | POA: Diagnosis not present

## 2024-09-08 DIAGNOSIS — Z113 Encounter for screening for infections with a predominantly sexual mode of transmission: Secondary | ICD-10-CM | POA: Diagnosis not present

## 2024-09-08 DIAGNOSIS — A549 Gonococcal infection, unspecified: Secondary | ICD-10-CM | POA: Diagnosis not present

## 2024-09-08 DIAGNOSIS — R369 Urethral discharge, unspecified: Secondary | ICD-10-CM | POA: Diagnosis not present

## 2024-10-05 DIAGNOSIS — R309 Painful micturition, unspecified: Secondary | ICD-10-CM | POA: Diagnosis not present

## 2024-10-05 DIAGNOSIS — M549 Dorsalgia, unspecified: Secondary | ICD-10-CM | POA: Diagnosis not present
